# Patient Record
Sex: Male | Born: 1970 | Race: White | Hispanic: No | Marital: Married | Smoking: Never smoker
Health system: Southern US, Community
[De-identification: ages and names within clinical notes are randomized; demographics above are authoritative.]

## PROBLEM LIST (undated history)

## (undated) DIAGNOSIS — I10 Essential (primary) hypertension: Secondary | ICD-10-CM

## (undated) HISTORY — DX: Essential (primary) hypertension: I10

## (undated) HISTORY — PX: TONSILLECTOMY AND ADENOIDECTOMY: SUR1326

---

## 2006-10-24 ENCOUNTER — Ambulatory Visit: Payer: Self-pay | Admitting: Family Medicine

## 2006-10-24 DIAGNOSIS — I10 Essential (primary) hypertension: Secondary | ICD-10-CM | POA: Insufficient documentation

## 2006-11-08 ENCOUNTER — Ambulatory Visit: Payer: Self-pay

## 2006-11-08 ENCOUNTER — Ambulatory Visit: Payer: Self-pay | Admitting: Cardiology

## 2006-11-08 ENCOUNTER — Encounter: Payer: Self-pay | Admitting: Family Medicine

## 2006-11-17 ENCOUNTER — Encounter: Payer: Self-pay | Admitting: Family Medicine

## 2006-11-20 ENCOUNTER — Encounter: Payer: Self-pay | Admitting: Family Medicine

## 2006-11-20 LAB — CONVERTED CEMR LAB
Albumin: 4.6 g/dL (ref 3.5–5.2)
BUN: 13 mg/dL (ref 6–23)
CO2: 25 meq/L (ref 19–32)
Calcium: 9 mg/dL (ref 8.4–10.5)
Chloride: 105 meq/L (ref 96–112)
Cholesterol, target level: 200 mg/dL
Cholesterol: 237 mg/dL — ABNORMAL HIGH (ref 0–200)
Glucose, Bld: 94 mg/dL (ref 70–99)
Potassium: 4.3 meq/L (ref 3.5–5.3)
Sodium: 140 meq/L (ref 135–145)
TSH: 3.803 microintl units/mL (ref 0.350–5.50)
Total CHOL/HDL Ratio: 6.4
Total Protein: 7.2 g/dL (ref 6.0–8.3)
VLDL: 44 mg/dL — ABNORMAL HIGH (ref 0–40)

## 2006-11-22 ENCOUNTER — Telehealth: Payer: Self-pay | Admitting: Family Medicine

## 2006-11-28 ENCOUNTER — Encounter: Admission: RE | Admit: 2006-11-28 | Discharge: 2006-11-28 | Payer: Self-pay | Admitting: Family Medicine

## 2006-11-28 ENCOUNTER — Ambulatory Visit: Payer: Self-pay | Admitting: Family Medicine

## 2006-12-06 ENCOUNTER — Encounter: Payer: Self-pay | Admitting: Family Medicine

## 2007-11-07 ENCOUNTER — Ambulatory Visit: Payer: Self-pay | Admitting: Family Medicine

## 2007-11-07 ENCOUNTER — Encounter: Admission: RE | Admit: 2007-11-07 | Discharge: 2007-11-07 | Payer: Self-pay | Admitting: Family Medicine

## 2007-11-07 DIAGNOSIS — M545 Low back pain: Secondary | ICD-10-CM

## 2007-11-14 ENCOUNTER — Encounter: Admission: RE | Admit: 2007-11-14 | Discharge: 2007-12-19 | Payer: Self-pay | Admitting: Family Medicine

## 2007-11-14 ENCOUNTER — Encounter: Payer: Self-pay | Admitting: Family Medicine

## 2007-11-21 ENCOUNTER — Telehealth: Payer: Self-pay | Admitting: Family Medicine

## 2007-12-14 ENCOUNTER — Encounter: Payer: Self-pay | Admitting: Family Medicine

## 2008-07-09 ENCOUNTER — Ambulatory Visit: Payer: Self-pay | Admitting: Family Medicine

## 2008-07-10 ENCOUNTER — Encounter: Payer: Self-pay | Admitting: Family Medicine

## 2008-07-10 LAB — CONVERTED CEMR LAB
Albumin: 4.8 g/dL (ref 3.5–5.2)
Alkaline Phosphatase: 84 units/L (ref 39–117)
BUN: 15 mg/dL (ref 6–23)
Glucose, Bld: 95 mg/dL (ref 70–99)
Potassium: 4.6 meq/L (ref 3.5–5.3)
Total Bilirubin: 0.6 mg/dL (ref 0.3–1.2)

## 2008-07-11 LAB — CONVERTED CEMR LAB
Free T4: 1.08 ng/dL
T3, Free: 3.3 pg/mL

## 2008-08-05 ENCOUNTER — Ambulatory Visit: Payer: Self-pay | Admitting: Family Medicine

## 2008-08-06 LAB — CONVERTED CEMR LAB
Chloride: 103 meq/L (ref 96–112)
Creatinine, Ser: 1.13 mg/dL (ref 0.40–1.50)

## 2009-05-04 ENCOUNTER — Ambulatory Visit: Payer: Self-pay | Admitting: Family Medicine

## 2009-08-06 ENCOUNTER — Telehealth: Payer: Self-pay | Admitting: Family Medicine

## 2009-08-20 ENCOUNTER — Ambulatory Visit: Payer: Self-pay | Admitting: Family

## 2009-08-20 DIAGNOSIS — M719 Bursopathy, unspecified: Secondary | ICD-10-CM

## 2009-08-20 DIAGNOSIS — M67919 Unspecified disorder of synovium and tendon, unspecified shoulder: Secondary | ICD-10-CM | POA: Insufficient documentation

## 2010-02-15 ENCOUNTER — Telehealth: Payer: Self-pay | Admitting: Family Medicine

## 2010-02-22 ENCOUNTER — Ambulatory Visit: Payer: Self-pay | Admitting: Family Medicine

## 2010-03-02 ENCOUNTER — Telehealth: Payer: Self-pay | Admitting: Family Medicine

## 2010-05-05 NOTE — Progress Notes (Signed)
Summary: refill HCTZ  Phone Note Refill Request Message from:  Patient on February 15, 2010 5:19 PM  Refills Requested: Medication #1:  HYDROCHLOROTHIAZIDE 25 MG TABS Take 1 tablet by mouth once a day.   Dosage confirmed as above?Dosage Confirmed   Supply Requested: 1 month   Last Refilled: 06/23/2009 Next Appointment Scheduled: 02-22-10 Dr Linford Arnold Initial call taken by: Mervin Kung CMA Duncan Dull),  February 15, 2010 5:19 PM    Prescriptions: HYDROCHLOROTHIAZIDE 25 MG TABS (HYDROCHLOROTHIAZIDE) Take 1 tablet by mouth once a day  #30 x 0   Entered by:   Mervin Kung CMA (AAMA)   Authorized by:   [name] MD   Signed by:   Mervin Kung CMA (AAMA) on 02/15/2010   Method used:   Electronically to        [facility] [phone]* (retail)       [address] [place], [place]  [address]       Ph: [phone]       Fax: [phone]   RxID:   [id number]

## 2010-05-05 NOTE — Progress Notes (Signed)
Summary: Med  Phone Note Call from Patient Call back at Home Phone [phone]   Caller: Patient Call For: [name] MD Summary of Call: pt wants to know if you would send his Nasonex to his mail order company for 90 day supply like his other meds Initial call taken by: Kathlene November,  Aug 06, 2009 3:12 PM  Follow-up for Phone Call        OK to send to his mail order. Dont know company.  Follow-up by: [name] MD,  Aug 06, 2009 4:38 PM    Prescriptions: NASONEX 50 MCG/ACT  SUSP (MOMETASONE FUROATE) Use one spray each nostril every day  #90 day sup x 4   Entered by:   Kathlene November   Authorized by:   [name] MD   Signed by:   Kathlene November on 08/06/2009   Method used:   Print then Give to Patient   RxID:   [id number]

## 2010-05-05 NOTE — Assessment & Plan Note (Signed)
Summary: Rt. shoulder pain- jr   Vital Signs:  Patient profile:   40 year old male Height:      72 inches Weight:      199.50 pounds BMI:     27.15 Temp:     97.9 degrees F oral Pulse rate:   79 / minute Pulse rhythm:   regular Resp:     16 per minute BP sitting:   145 / 87  (right arm) Cuff size:   regular  Vitals Entered By: Mervin Kung CMA (Aug 20, 2009 2:45 PM) CC: room 4  Right shoulder pain x 1 week. Is Patient Diabetic? No   Primary Care Provider:  Linford Arnold, C  CC:  room 4  Right shoulder pain x 1 week.Marland Kitchen  History of Present Illness: Mr Fackler is a 40 year old male who presents today with complaint of 10 day history of right shoulder pain.  Notes that he has been working hard building a garage. Pain is improved with NSAIDS, worsens with activity. Overall feeling better.    Allergies (verified): 1)  ! * Benadryl  Physical Exam  General:  Well-developed,well-nourished,in no acute distress; alert,appropriate and cooperative throughout examination Head:  Normocephalic and atraumatic without obvious abnormalities. No apparent alopecia or balding. Msk:  No swelling of shoulder or tenderness to palpation.  Full ROM of shoulder, passive and active.  Did not shoulder "click" with ROM.   Psych:  Cognition and judgment appear intact. Alert and cooperative with normal attention span and concentration. No apparent delusions, illusions, hallucinations   Impression & Recommendations:  Problem # 1:  BURSITIS, RIGHT SHOULDER (ICD-726.10) Assessment New Likely due to recent work building garage.  Recommended, rest, ice, NSAIDS.  If not resolved in 1 month will consider referral to orthopedics for further evaluation.  Complete Medication List: 1)  Nasonex 50 Mcg/act Susp (Mometasone furoate) .... Use one spray each nostril every day 2)  Hydrochlorothiazide 25 Mg Tabs (Hydrochlorothiazide) .... Take 1 tablet by mouth once a day  Patient Instructions: 1)  You may move around  but avoid painful motions. Apply ice to sore area for 20 minutes 3-4 times a day for 2-3 days. 2)  Take 400-600mg  of Ibuprofen (Advil, Motrin) with food every 4-6 hours as needed for relief of pain 3)  Call if your symptoms worsen or are not resolved in 1 month.  Current Allergies (reviewed today): ! * BENADRYL

## 2010-05-05 NOTE — Assessment & Plan Note (Signed)
Summary: HTN   Vital Signs:  Patient profile:   40 year old male Height:      72 inches Weight:      201 pounds Pulse rate:   76 / minute BP sitting:   145 / 92  (right arm) Cuff size:   regular  Vitals Entered By: Avon Gully CMA, Duncan Dull) (February 22, 2010 9:48 AM) CC: f/u Bp, Hypertension Management   CC:  f/u Bp and Hypertension Management.  Hypertension History:      He denies headache, chest pain, palpitations, dyspnea with exertion, orthopnea, PND, peripheral edema, visual symptoms, neurologic problems, syncope, and side effects from treatment.  He notes no problems with any antihypertensive medication side effects.  Further comments include: Home BPs runnin in the 140s. Marland Kitchen        Positive major cardiovascular risk factors include hypertension and family history for ischemic heart disease (females less than 12 years old).  Negative major cardiovascular risk factors include male age less than 41 years old, no history of diabetes, and non-tobacco-user status.        Further assessment for target organ damage reveals no history of ASHD, stroke/TIA, or peripheral vascular disease.     Current Medications (verified): 1)  Nasonex 50 Mcg/act  Susp (Mometasone Furoate) .... Use One Spray Each Nostril Every Day 2)  Hydrochlorothiazide 25 Mg Tabs (Hydrochlorothiazide) .... Take 1 Tablet By Mouth Once A Day  Allergies (verified): 1)  ! * Benadryl  Comments:  Nurse/Medical Assistant: The patient's medications and allergies were reviewed with the patient and were updated in the Medication and Allergy Lists. Avon Gully CMA, Duncan Dull) (February 22, 2010 9:49 AM)  Physical Exam  General:  Well-developed,well-nourished,in no acute distress; alert,appropriate and cooperative throughout examination Head:  Normocephalic and atraumatic without obvious abnormalities. No apparent alopecia or balding. Lungs:  Normal respiratory effort, chest expands symmetrically. Lungs are clear to  auscultation, no crackles or wheezes. Heart:  Normal rate and regular rhythm. S1 and S2 normal without gallop, murmur, click, rub or other extra sounds. no carotic bruits.  Skin:  no rashes.   Psych:  Cognition and judgment appear intact. Alert and cooperative with normal attention span and concentration. No apparent delusions, illusions, hallucinations   Impression & Recommendations:  Problem # 1:  HYPERTENSION, BENIGN (ICD-401.1) Will add ACE to his regimen. Discussed potenetial SE of the medication. Call if any concerns. F/U in one month for recheck. If normal then will f/u in 4 months after that. Check BMP at next OV. Reviewed DASH diet as well.  His updated medication list for this problem includes:    Hydrochlorothiazide 25 Mg Tabs (Hydrochlorothiazide) .Marland Kitchen... Take 1 tablet by mouth once a day  BP today: 145/92 Prior BP: 145/87 (08/20/2009)  Prior 10 Yr Risk Heart Disease: 6 % (08/05/2008)  Labs Reviewed: K+: 4.7 (08/05/2008) Creat: : 1.13 (08/05/2008)   Chol: 237 (11/17/2006)   HDL: 37 (11/17/2006)   LDL: 156 (11/17/2006)   TG: 219 (11/17/2006)  Complete Medication List: 1)  Nasonex 50 Mcg/act Susp (Mometasone furoate) .... Use one spray each nostril every day 2)  Hydrochlorothiazide 25 Mg Tabs (Hydrochlorothiazide) .... Take 1 tablet by mouth once a day  Hypertension Assessment/Plan:      The patient's hypertensive risk group is category B: At least one risk factor (excluding diabetes) with no target organ damage.  His calculated 10 year risk of coronary heart disease is 9 %.  Today's blood pressure is 145/92.    Patient Instructions: 1)  DASH diet  ( googl it. http://www.myers.net/ website)  2)  Follow up in one month to recheck your blood pressure.    Orders Added: 1)  Est. Patient Level III [82956]

## 2010-05-05 NOTE — Progress Notes (Signed)
Summary: meds  Phone Note Call from Patient   Caller: Dad Call For: [name] MD Summary of Call: wife called and states there was supposed to be another medication that was to be added in addition to the HCTZ but it wasnt at the pharm.please advise Initial call taken by: Avon Gully CMA, Duncan Dull),  March 02, 2010 9:00 AM  Follow-up for Phone Call        Sorry, will send.  Follow-up by: [name] MD,  March 02, 2010 9:23 AM    New/Updated Medications: LISINOPRIL-HYDROCHLOROTHIAZIDE 20-25 MG TABS (LISINOPRIL-HYDROCHLOROTHIAZIDE) Take 1 tablet by mouth once a day Prescriptions: LISINOPRIL-HYDROCHLOROTHIAZIDE 20-25 MG TABS (LISINOPRIL-HYDROCHLOROTHIAZIDE) Take 1 tablet by mouth once a day  #30 x 1   Entered and Authorized by:   [name] MD   Signed by:   [name] MD on 03/02/2010   Method used:   Electronically to        [facility] [phone]* (retail)       [address] [place], [place]  [address]       Ph: [phone]       Fax: [phone]   RxID:   [id number]

## 2010-05-05 NOTE — Assessment & Plan Note (Signed)
Summary: back pain, acute   Vital Signs:  Patient profile:   40 year old male Height:      72 inches Weight:      204 pounds BMI:     27.77 Pulse rate:   71 / minute BP sitting:   138 / 84  (left arm) Cuff size:   regular  Vitals Entered By: Kathlene November (May 04, 2009 10:50 AM) CC: muscle spasms in lower back- went hiking over the weekend and lifting yesterday   Primary Care Provider:  Linford Arnold, C  CC:  muscle spasms in lower back- went hiking over the weekend and lifting yesterday.  History of Present Illness: muscle spasms in lower back- went hiking over the weekend and lifting some wood yesterday. Back felt progresively tighter. Has been getting sharp shooting pain. Unable to sleep last night.  Had back injury about 1.5 years ago and went to PT.  Feels centered in his back. IBU - helps some. No numbness and tingling. No fever or dysuria.   Current Medications (verified): 1)  Nasonex 50 Mcg/act  Susp (Mometasone Furoate) .... Use One Spray Each Nostril Every Day 2)  Hydrochlorothiazide 25 Mg Tabs (Hydrochlorothiazide) .... Take 1 Tablet By Mouth Once A Day  Allergies (verified): No Known Drug Allergies  Comments:  Nurse/Medical Assistant: The patient's medications and allergies were reviewed with the patient and were updated in the Medication and Allergy Lists. 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 Married to [employer] with 3 children.   Never Smoked Alcohol use-yes Drug use-no Regular exercise-yes  Physical Exam  General:  Well-developed,well-nourished,in no acute distress; alert,appropriate and cooperative throughout examination Msk:  Normal flexion, extension, rotation righta nd left, and side bendind.  Neg straight leg raise bilat.  Hip, knee, and ankle strength 5/5 bilat. Nontender over the lumbar spine or paraspinous muscles or the SI joints.    Extremities:  No LE edema.  Neurologic:  Patellar 2+ bilat.    Impression & Recommendations:  Problem # 1:  BACK PAIN, LUMBAR (ICD-724.2) Assessment Deteriorated NSAID adn muscle relaxer for pain relief. Reviewed exercises to stretch out the low back.Says he has some stretches at home for his PT that he can do. Avoid any heavy lifting or alot of bending or stooping. Continue his IBU 800mg  three times a day.  Can use the tramadol on top of that for extra pain relief, esp at night since keeping him awake.  Warned about potential sedation of the muscle relaxer. Don't drive iwth it. Call if not better in 2-3 weeks or sooner if getting worse.  His updated medication list for this problem includes:    Cyclobenzaprine Hcl 10 Mg Tabs (Cyclobenzaprine hcl) .Marland Kitchen... Take 1 tablet by mouth three times a day as needed muscle spasm    Tramadol Hcl 50 Mg Tabs (Tramadol hcl) .Marland Kitchen... Take 1 tablet by mouth three times a day as needed severe pain  Complete Medication List: 1)  Nasonex 50 Mcg/act Susp (Mometasone furoate) .... Use one spray each nostril every day 2)  Hydrochlorothiazide 25 Mg Tabs (Hydrochlorothiazide) .... Take 1 tablet by mouth once a day 3)  Cyclobenzaprine Hcl 10 Mg Tabs (Cyclobenzaprine hcl) .... Take 1 tablet by mouth three times a day as needed muscle spasm 4)  Tramadol Hcl 50 Mg Tabs (Tramadol hcl) .... Take 1 tablet by mouth three times a day as needed severe pain Prescriptions: TRAMADOL HCL  50 MG TABS (TRAMADOL HCL) Take 1 tablet by mouth three times a day as needed severe pain  #30 x 0   Entered and Authorized by:   [name] MD   Signed by:   [name] MD on 05/04/2009   Method used:   Electronically to        [facility] [phone]* (retail)       [address] [place], [place]  [address]       Ph: [phone]       Fax: [phone]   RxID:   [id number] CYCLOBENZAPRINE HCL 10 MG TABS (CYCLOBENZAPRINE HCL) Take 1 tablet by mouth three times a day as  needed muscle spasm  #30 x 0   Entered and Authorized by:   [name] MD   Signed by:   [name] MD on 05/04/2009   Method used:   Electronically to        [facility] [phone]* (retail)       [address] [place], [place]  [address]       Ph: [phone]       Fax: [phone]   RxID:   [id number]

## 2010-08-17 NOTE — Procedures (Signed)
[facility]                              EXERCISE TREADMILL   Brett Donaldson, Brett Donaldson                      MRN:          [id number]  DATE:11/08/2006                            DOB:          Oct 05, 1970    Brett Donaldson comes in today for a stress test scheduled by Dr. Linford Arnold  of [facility]. [facility].   He has been having some substernal chest pressure that is not exertion  related.   His baseline EKG is normal.   He exercised Bruce protocol for 10 minutes and 22 seconds.  METS level  achieved was 12.3.  His peak heart rate was 181, which was 98% of  predicted maximum heart rate.  His blood pressure increased to 169/67.  There were no ST segment changes and no chest pain.  Blood pressure came  down slowly in recovery.   CONCLUSION:  1. Negative adequate exercise treadmill.  2. Good exercise tolerance.  3. Low risk for obstructive coronary disease based on this study.     Thomas C. Daleen Squibb, MD, [facility]  Electronically Signed    TCW/MedQ  DD: 11/08/2006  DT: 11/08/2006  Job #: [id number]   cc:   [name], M.D.

## 2010-08-17 NOTE — Letter (Signed)
November 08, 2006    [name], M.D.  [facility]. [facility]  [address]  [address]  [place], [place] [address]   RE:  Brett Donaldson, Brett Donaldson  MRN:  [id number]  /  DOB:  [birth year]   Dear Dr. Linford Arnold:   I had the pleasure of exercising Brett Donaldson today in the office.  Please find enclosed copy of his stress test results.   He had excellent exercise tolerance with no ST segment changes, and no  symptom reproduction.  I think his risk of coronary disease is at this  point low based on this study.   I strongly agree with you that his blood pressure should be treated, and  also with his strong family history, perhaps aggressively treating his  lipids as well, depending on his desire to be proactive.  I will leave  this to your expertise.   Thank you again for allowing [place] to see him.    Sincerely,      Jesse Sans. Daleen Squibb, MD, [facility]  Electronically Signed    TCW/MedQ  DD: 11/08/2006  DT: 11/08/2006  Job #: [id number]

## 2011-07-15 ENCOUNTER — Encounter: Payer: Self-pay | Admitting: *Deleted

## 2011-07-18 ENCOUNTER — Ambulatory Visit (INDEPENDENT_AMBULATORY_CARE_PROVIDER_SITE_OTHER): Payer: BC Managed Care – PPO | Admitting: Family Medicine

## 2011-07-18 ENCOUNTER — Encounter: Payer: Self-pay | Admitting: Family Medicine

## 2011-07-18 VITALS — BP 142/89 | HR 114 | Ht 72.0 in | Wt 206.0 lb

## 2011-07-18 DIAGNOSIS — I10 Essential (primary) hypertension: Secondary | ICD-10-CM

## 2011-07-18 DIAGNOSIS — E785 Hyperlipidemia, unspecified: Secondary | ICD-10-CM | POA: Insufficient documentation

## 2011-07-18 MED ORDER — LISINOPRIL-HYDROCHLOROTHIAZIDE 10-12.5 MG PO TABS: 1.0000 | ORAL_TABLET | Freq: Every day | ORAL | Status: DC

## 2011-07-18 MED ORDER — SIMVASTATIN 40 MG PO TABS: 40.0000 mg | ORAL_TABLET | Freq: Every evening | ORAL | Status: DC

## 2011-07-18 MED ORDER — MOMETASONE FUROATE 50 MCG/ACT NA SUSP: 2.0000 | Freq: Every day | NASAL | Status: DC

## 2011-07-18 MED ORDER — FLUTICASONE PROPIONATE 50 MCG/ACT NA SUSP: 2.0000 | Freq: Every day | NASAL | Status: DC

## 2011-07-18 NOTE — Progress Notes (Signed)
Addended by: [name] D on: 07/18/2011 01:09 PM   Modules accepted: Orders

## 2011-07-18 NOTE — Progress Notes (Signed)
  Subjective:    Patient ID: Brett Donaldson, male    DOB: [birth year], 41 y.o.   MRN: [id number]  HPI HTN - Neesd to restart BP pill.  N oCP or SOB. No problems. Just never followed up so has been off his pill for one year. No regular exercise.  Had some labs done at work.    Review of Systems     Objective:   Physical Exam  Constitutional: He is oriented to person, place, and time. He appears well-developed and well-nourished.  HENT:  Head: Normocephalic and atraumatic.  Cardiovascular: Normal rate, regular rhythm and normal heart sounds.        No carotid bruits. Radial pulse 2+.   Pulmonary/Chest: Effort normal and breath sounds normal.  Neurological: He is alert and oriented to person, place, and time.  Skin: Skin is warm and dry.  Psychiatric: He has a normal mood and affect. His behavior is normal.          Assessment & Plan:  HTN- uncontrolled. Need to restart medication. I did lower the dose. He did tolerate it well the one month he took it. F/u in 6 weeks and recheck BMP at that time.   Hyperlipidemia - he will fax a copy of his cholesterol labwork from work that he had done earlier this year. Because of his family history of CAD and his personal history high blood pressure like him to start a statin. We discussed pros and cons potential for benefit and risks. We'll start simvastatin 40 mg at bedtime. When he follows up in 6 weeks we can recheck a lipid panel and CMP at that time.

## 2011-08-10 ENCOUNTER — Other Ambulatory Visit: Payer: Self-pay | Admitting: *Deleted

## 2011-08-10 MED ORDER — LISINOPRIL-HYDROCHLOROTHIAZIDE 10-12.5 MG PO TABS: 1.0000 | ORAL_TABLET | Freq: Every day | ORAL | Status: DC

## 2011-08-10 MED ORDER — SIMVASTATIN 40 MG PO TABS: 40.0000 mg | ORAL_TABLET | Freq: Every evening | ORAL | Status: DC

## 2011-08-30 ENCOUNTER — Ambulatory Visit (INDEPENDENT_AMBULATORY_CARE_PROVIDER_SITE_OTHER): Payer: BC Managed Care – PPO | Admitting: Family Medicine

## 2011-08-30 ENCOUNTER — Encounter: Payer: Self-pay | Admitting: Family Medicine

## 2011-08-30 VITALS — BP 112/63 | HR 66 | Wt 201.0 lb

## 2011-08-30 DIAGNOSIS — I10 Essential (primary) hypertension: Secondary | ICD-10-CM

## 2011-08-30 DIAGNOSIS — E785 Hyperlipidemia, unspecified: Secondary | ICD-10-CM

## 2011-08-30 NOTE — Progress Notes (Signed)
  Subjective:    Patient ID: Brett Donaldson, male    DOB: 09-Dec-1970, 41 y.o.   MRN: [id number]  HPI HTN - regular exercise, has been walking every night for exercise. No chest pain or shortness of breath. He did restart the medication and is here for repeat blood pressure check. He'll come off of it previously. Though we didn't restart it at a lower dose than what he had been taking. He did have some dizziness the first week or 2 that he started the process that has resolved.   Review of Systems     Objective:   Physical Exam  Constitutional: He is oriented to person, place, and time. He appears well-developed and well-nourished.  HENT:  Head: Normocephalic and atraumatic.  Cardiovascular: Normal rate, regular rhythm and normal heart sounds.   Pulmonary/Chest: Effort normal and breath sounds normal.  Neurological: He is alert and oriented to person, place, and time.  Skin: Skin is warm and dry.  Psychiatric: He has a normal mood and affect. His behavior is normal.          Assessment & Plan:  HTN -well controlled. Followup in 6 months. He should have enough refills until then. I did warn that because of the diuretic use be careful being out in the sun for prolonged periods and making sure that he is well-hydrated. Continue regular exercise and low salt diet. He is due for CMP and fasting lipid panel.  Hyperlipidemia-he is well over due to recheck his lipids. He says he has been taking her statin regularly without any side effects or complications.

## 2011-08-31 ENCOUNTER — Other Ambulatory Visit: Payer: Self-pay | Admitting: *Deleted

## 2011-08-31 ENCOUNTER — Telehealth: Payer: Self-pay | Admitting: *Deleted

## 2011-08-31 LAB — COMPLETE METABOLIC PANEL WITH GFR
ALT: 33 U/L (ref 0–53)
AST: 35 U/L (ref 0–37)
Albumin: 4.8 g/dL (ref 3.5–5.2)
BUN: 15 mg/dL (ref 6–23)
Calcium: 9.9 mg/dL (ref 8.4–10.5)
Chloride: 103 mEq/L (ref 96–112)
Potassium: 4.5 mEq/L (ref 3.5–5.3)
Sodium: 139 mEq/L (ref 135–145)
Total Protein: 7.1 g/dL (ref 6.0–8.3)

## 2011-08-31 LAB — LIPID PANEL
LDL Cholesterol: 101 mg/dL — ABNORMAL HIGH (ref 0–99)
VLDL: 53 mg/dL — ABNORMAL HIGH (ref 0–40)

## 2011-08-31 MED ORDER — AZITHROMYCIN 250 MG PO TABS: ORAL_TABLET | ORAL | Status: AC

## 2011-08-31 MED ORDER — AZITHROMYCIN 250 MG PO TABS: ORAL_TABLET | ORAL | Status: DC

## 2011-08-31 NOTE — Telephone Encounter (Signed)
Z-Pack sent ot pharmacy per Dr. Shelah Lewandowsky request.

## 2012-06-09 ENCOUNTER — Other Ambulatory Visit: Payer: Self-pay | Admitting: Family Medicine

## 2012-06-12 ENCOUNTER — Telehealth: Payer: Self-pay | Admitting: *Deleted

## 2012-06-12 NOTE — Telephone Encounter (Signed)
Called and lmovm informing pt that we will not refill his meds until he makes an appt to be seen.Loralee Pacas [place]

## 2013-05-01 ENCOUNTER — Ambulatory Visit (INDEPENDENT_AMBULATORY_CARE_PROVIDER_SITE_OTHER): Payer: BC Managed Care – PPO | Admitting: Family Medicine

## 2013-05-01 ENCOUNTER — Encounter: Payer: Self-pay | Admitting: Family Medicine

## 2013-05-01 VITALS — BP 134/75 | HR 64 | Temp 97.9°F | Ht 72.0 in | Wt 197.0 lb

## 2013-05-01 DIAGNOSIS — I10 Essential (primary) hypertension: Secondary | ICD-10-CM

## 2013-05-01 DIAGNOSIS — M545 Low back pain, unspecified: Secondary | ICD-10-CM

## 2013-05-01 DIAGNOSIS — E785 Hyperlipidemia, unspecified: Secondary | ICD-10-CM

## 2013-05-01 MED ORDER — SIMVASTATIN 40 MG PO TABS: ORAL_TABLET | ORAL | Status: DC

## 2013-05-01 MED ORDER — PREDNISONE 20 MG PO TABS: 40.0000 mg | ORAL_TABLET | Freq: Every day | ORAL | Status: DC

## 2013-05-01 MED ORDER — CYCLOBENZAPRINE HCL 10 MG PO TABS: 10.0000 mg | ORAL_TABLET | Freq: Every evening | ORAL | Status: DC | PRN

## 2013-05-01 MED ORDER — LISINOPRIL-HYDROCHLOROTHIAZIDE 10-12.5 MG PO TABS: ORAL_TABLET | ORAL | Status: DC

## 2013-05-01 NOTE — Progress Notes (Signed)
   Subjective:    Patient ID: Brett Donaldson, male    DOB: 1970-06-02, 43 y.o.   MRN: [id number]  HPI pt reports that he is having low back pain for past 2 weeks. it wakes him up at night it feels like a catching/grabbing that brings him to his knees. he has a hx of a back injury. No raditaiton into the left.  Worse on the left.  NSAIDs help some.  Initially injured back about 3 years ago and took him 5-6 months. Did PT at that time but didn't seem to really help and it finally got better.   He has had sciatica in the past but none right now. It's worse with changing position and bending over and rolling over in bed.  Hypertension- Pt denies chest pain, SOB, dizziness, or heart palpitations.  Taking meds as directed w/o problems.  Denies medication side effects.    Hyper lipidemia-tolerating statin well without any side effects or myalgias. Does need new refill prescription. He ran out a few weeks ago. He did have cholesterol checked at work and says he will fax this over to us.   Review of Systems     Objective:   Physical Exam  Constitutional: He is oriented to person, place, and time. He appears well-developed and well-nourished.  HENT:  Head: Normocephalic and atraumatic.  Cardiovascular: Normal rate, regular rhythm and normal heart sounds.   Pulmonary/Chest: Effort normal and breath sounds normal.  Musculoskeletal:  Low back with normal flexion, extension, rotation right and left, side bending. He did have significant pain with forward flexion. Nontender over the lumbar spine itself. Nontender over the SI joints. She's mildly tender over the paraspinous muscles. Hip, knee, ankle strength is 5 out of 5 bilaterally with patellar reflexes 2+ bilaterally.  Neurological: He is alert and oriented to person, place, and time.  Skin: Skin is warm and dry.  Psychiatric: He has a normal mood and affect. His behavior is normal.          Assessment & Plan:  Low back pain - likely secondary  to musculoskeletal pain. He has had minor injuries and pain on and off for greater than 15 years. Discussed with him that initially we start with conservative care. Will start prednisone 40 mg daily x5 days. Switch to ibuprofen which tends to work well for him. Take 3 times a day with food and water. Stop immediately if any GI irritation or upset. Will add muscle relaxer to his regimen. I warned him not to take it if he is going to operate a motor vehicle. He says it doesn't work well for him in the past. Also given a handout on some stretches and exercises to do on his own at home. Follow up if not improving over the next 2-3 weeks. At that point we can consider imaging and further evaluation if needed. He's not having any sciatic-type symptoms.  HTN- well controlled. Continue current regimen. Refill sent to mail order pharmacy today. Followup in 6 months.  Hyperlipidemia  - medication sent to mail order pharmacy today. He said he had blood work done at work and he will fax this over to us. Followup in 6 months.

## 2013-09-17 LAB — LIPID PANEL
CHOLESTEROL: 182 mg/dL (ref 0–200)
HDL: 34 mg/dL — AB (ref 35–70)
LDL Cholesterol: 96 mg/dL
Triglycerides: 259 mg/dL — AB (ref 40–160)

## 2013-09-17 LAB — HEMOGLOBIN A1C: Hgb A1c MFr Bld: 5.3 % (ref 4.0–6.0)

## 2013-10-24 ENCOUNTER — Ambulatory Visit (INDEPENDENT_AMBULATORY_CARE_PROVIDER_SITE_OTHER): Payer: BLUE CROSS/BLUE SHIELD | Admitting: Family Medicine

## 2013-10-24 ENCOUNTER — Encounter: Payer: Self-pay | Admitting: Family Medicine

## 2013-10-24 VITALS — BP 124/76 | HR 76 | Ht 72.0 in | Wt 198.0 lb

## 2013-10-24 DIAGNOSIS — Z0000 Encounter for general adult medical examination without abnormal findings: Secondary | ICD-10-CM

## 2013-10-24 MED ORDER — SIMVASTATIN 40 MG PO TABS: ORAL_TABLET | ORAL | Status: DC

## 2013-10-24 MED ORDER — LISINOPRIL-HYDROCHLOROTHIAZIDE 10-12.5 MG PO TABS: ORAL_TABLET | ORAL | Status: DC

## 2013-10-24 NOTE — Progress Notes (Signed)
Subjective:    Patient ID: Brett Donaldson, male    DOB: 05-26-1970, 43 y.o.   MRN: [id number]  HPI Patient here for complete physical exam today.  Hypertension- Pt denies chest pain, SOB, dizziness, or heart palpitations.  Taking meds as directed w/o problems.  Denies medication side effects.     Review of Systems Comprehensive review of systems is negative.  BP 124/76  Pulse 76  Ht 6' (1.829 m)  Wt 198 lb (89.812 kg)  BMI 26.85 kg/m2    Allergies  Allergen Reactions  . Diphenhydramine Hcl     REACTION: hives    Past Medical History  Diagnosis Date  . Hypertension     Past Surgical History  Procedure Laterality Date  . Tonsillectomy and adenoidectomy      History   Social History  . Marital Status: Married    Spouse Name: N/A    Number of Children: N/A  . Years of Education: N/A   Occupational History  . Not on file.   Social History Main Topics  . Smoking status: Never Smoker   . Smokeless tobacco: Not on file  . Alcohol Use: Yes  . Drug Use: No  . Sexual Activity:    Other Topics Concern  . Not on file   Social History Narrative  . No narrative on file    Family History  Problem Relation Age of Onset  . Heart disease Mother 40    5 valve bypass  . Hyperlipidemia Mother   . Hypertension Mother 93    Died age 45, smoker, obesity    Outpatient Encounter Prescriptions as of 10/24/2013  Medication Sig  . lisinopril-hydrochlorothiazide (PRINZIDE,ZESTORETIC) 10-12.5 MG per tablet TAKE 1 TABLET DAILY  . simvastatin (ZOCOR) 40 MG tablet TAKE 1 TABLET AT BEDTIME  . [DISCONTINUED] lisinopril-hydrochlorothiazide (PRINZIDE,ZESTORETIC) 10-12.5 MG per tablet TAKE 1 TABLET DAILY  . [DISCONTINUED] simvastatin (ZOCOR) 40 MG tablet TAKE 1 TABLET AT BEDTIME  . [DISCONTINUED] cyclobenzaprine (FLEXERIL) 10 MG tablet Take 1 tablet (10 mg total) by mouth at bedtime as needed for muscle spasms.  . [DISCONTINUED] fluticasone (FLONASE) 50 MCG/ACT nasal spray Place  2 sprays into the nose daily.  . [DISCONTINUED] predniSONE (DELTASONE) 20 MG tablet Take 2 tablets (40 mg total) by mouth daily.          Objective:   Physical Exam  Constitutional: He is oriented to person, place, and time. He appears well-developed and well-nourished.  HENT:  Head: Normocephalic and atraumatic.  Right Ear: External ear normal.  Left Ear: External ear normal.  Nose: Nose normal.  Mouth/Throat: Oropharynx is clear and moist.  Eyes: Conjunctivae and EOM are normal. Pupils are equal, round, and reactive to light.  Neck: Normal range of motion. Neck supple. No thyromegaly present.  Cardiovascular: Normal rate, regular rhythm, normal heart sounds and intact distal pulses.   Pulmonary/Chest: Effort normal and breath sounds normal.  Abdominal: Soft. Bowel sounds are normal. He exhibits no distension and no mass. There is no tenderness. There is no rebound and no guarding.  Musculoskeletal: Normal range of motion.  Lymphadenopathy:    He has no cervical adenopathy.  Neurological: He is alert and oriented to person, place, and time. He has normal reflexes.  Skin: Skin is warm and dry.  Psychiatric: He has a normal mood and affect. His behavior is normal. Judgment and thought content normal.          Assessment & Plan:  CPE Keep up a regular exercise program  and make sure you are eating a healthy diet Try to eat 4 servings of dairy a day, or if you are lactose intolerant take a calcium with vitamin D daily.  Your vaccines are up to date.   Hypertension-well-controlled. Continue current regimen. Followup in 6 months.  Hyperlipidemia-due to recheck lipid panel. Continue simvastatin 40 mg for now.  He says he had a fair amount of blood work done through his employer. He will fax those to me. As you to make sure that he has up-to-date lipids and renal function because of his medications as well as a potassium.

## 2013-10-29 ENCOUNTER — Ambulatory Visit: Payer: BC Managed Care – PPO | Admitting: Family Medicine

## 2014-04-20 ENCOUNTER — Other Ambulatory Visit: Payer: Self-pay | Admitting: Family Medicine

## 2014-04-28 ENCOUNTER — Encounter: Payer: Self-pay | Admitting: Family Medicine

## 2014-04-28 ENCOUNTER — Ambulatory Visit (INDEPENDENT_AMBULATORY_CARE_PROVIDER_SITE_OTHER): Payer: BLUE CROSS/BLUE SHIELD | Admitting: Family Medicine

## 2014-04-28 VITALS — BP 120/82 | HR 93 | Ht 72.0 in | Wt 199.0 lb

## 2014-04-28 DIAGNOSIS — R1310 Dysphagia, unspecified: Secondary | ICD-10-CM | POA: Diagnosis not present

## 2014-04-28 DIAGNOSIS — E785 Hyperlipidemia, unspecified: Secondary | ICD-10-CM

## 2014-04-28 DIAGNOSIS — I10 Essential (primary) hypertension: Secondary | ICD-10-CM | POA: Diagnosis not present

## 2014-04-28 DIAGNOSIS — T148 Other injury of unspecified body region: Secondary | ICD-10-CM | POA: Diagnosis not present

## 2014-04-28 DIAGNOSIS — W57XXXA Bitten or stung by nonvenomous insect and other nonvenomous arthropods, initial encounter: Secondary | ICD-10-CM

## 2014-04-28 MED ORDER — LISINOPRIL-HYDROCHLOROTHIAZIDE 10-12.5 MG PO TABS: 1.0000 | ORAL_TABLET | Freq: Every day | ORAL | Status: DC

## 2014-04-28 NOTE — Progress Notes (Signed)
   Subjective:    Patient ID: Brett Donaldson, male    DOB: [birth year], 44 y.o.   MRN: [id number]  HPI Hypertension- Pt denies chest pain, SOB, dizziness, or heart palpitations.  Taking meds as directed w/o problems.  Denies medication side effects.  Brought in copy of lipids from June done at work  Dysphagia - Has bothered him for year.  Says can feel sometimes build in his chest. Can usually swallow hard and it passes. But the other day happened and had to vomit and took awhile pass. Occ heartburn but not frequent. TUMS usually works well. Usually every 2 weeks.   Also had spider bite on 5th toe on medial side on the left foot. Says has been there for 6 weeks and just wanted me to look at it, to make sure that it's healing well. He says he saw the 2 bite marks in the middle initially but never saw a spider or bug.  Review of Systems     Objective:   Physical Exam  Constitutional: He is oriented to person, place, and time. He appears well-developed and well-nourished.  HENT:  Head: Normocephalic and atraumatic.  Cardiovascular: Normal rate, regular rhythm and normal heart sounds.   Pulmonary/Chest: Effort normal and breath sounds normal.  Neurological: He is alert and oriented to person, place, and time.  Skin: Skin is warm and dry.  Psychiatric: He has a normal mood and affect. His behavior is normal.    I did examine the area on his fifth digit, left foot. It's a small approximately 1 cm callus. I see no erythema induration or drainage.      Assessment & Plan:  Hypertension-well-controlled. Continue current regimen. Follow-up in 6 months. Due for BMP to check potassium and kidney function.  Dysphagia-will refer to gastroneurology for further evaluation. Will likely need endoscopy with possible dilatation. Explained that these are most commonly caused by esophageal strictures. It doesn't feel like he has any significant heartburn type symptoms.  Insect bite-area on the 10 mostly  is consistent with a callus at this point. Offered to shave the callus off but he declined and says he will just keep an eye on it.  Hyperlipidemia-he did bring his cluster levels from work. His LDL was down to 96 and triglycerides had come down some to 259 which is fantastic. We discussed possibly considering switching him to atorvastatin at some point though he is hesitant since he does well on the simvastatin. Also consider adding to Fish oil to his regimen daily.

## 2014-04-29 LAB — BASIC METABOLIC PANEL WITH GFR
BUN: 19 mg/dL (ref 6–23)
CALCIUM: 9.6 mg/dL (ref 8.4–10.5)
CO2: 22 meq/L (ref 19–32)
CREATININE: 1.34 mg/dL (ref 0.50–1.35)
Chloride: 101 mEq/L (ref 96–112)
GFR, EST AFRICAN AMERICAN: 74 mL/min
GFR, Est Non African American: 64 mL/min
GLUCOSE: 68 mg/dL — AB (ref 70–99)
POTASSIUM: 4.4 meq/L (ref 3.5–5.3)
SODIUM: 136 meq/L (ref 135–145)

## 2014-04-29 NOTE — Progress Notes (Signed)
Quick Note:  All labs are normal. ______ 

## 2014-05-11 ENCOUNTER — Encounter: Payer: Self-pay | Admitting: Emergency Medicine

## 2014-07-18 ENCOUNTER — Other Ambulatory Visit: Payer: Self-pay | Admitting: Family Medicine

## 2014-10-15 ENCOUNTER — Other Ambulatory Visit: Payer: Self-pay | Admitting: Family Medicine

## 2014-12-11 ENCOUNTER — Other Ambulatory Visit: Payer: Self-pay | Admitting: Family Medicine

## 2014-12-29 ENCOUNTER — Encounter: Payer: Self-pay | Admitting: Family Medicine

## 2014-12-29 ENCOUNTER — Ambulatory Visit (INDEPENDENT_AMBULATORY_CARE_PROVIDER_SITE_OTHER): Payer: BLUE CROSS/BLUE SHIELD | Admitting: Family Medicine

## 2014-12-29 VITALS — BP 122/70 | HR 69 | Temp 98.8°F | Ht 72.0 in | Wt 195.0 lb

## 2014-12-29 DIAGNOSIS — Z23 Encounter for immunization: Secondary | ICD-10-CM | POA: Diagnosis not present

## 2014-12-29 DIAGNOSIS — Z0000 Encounter for general adult medical examination without abnormal findings: Secondary | ICD-10-CM

## 2014-12-29 DIAGNOSIS — E785 Hyperlipidemia, unspecified: Secondary | ICD-10-CM | POA: Diagnosis not present

## 2014-12-29 DIAGNOSIS — I10 Essential (primary) hypertension: Secondary | ICD-10-CM | POA: Diagnosis not present

## 2014-12-29 MED ORDER — LISINOPRIL-HYDROCHLOROTHIAZIDE 10-12.5 MG PO TABS: 1.0000 | ORAL_TABLET | Freq: Every day | ORAL | Status: DC

## 2014-12-29 MED ORDER — SIMVASTATIN 40 MG PO TABS: ORAL_TABLET | ORAL | Status: DC

## 2014-12-29 MED ORDER — MELOXICAM 7.5 MG PO TABS: 7.5000 mg | ORAL_TABLET | Freq: Two times a day (BID) | ORAL | Status: DC | PRN

## 2014-12-29 MED ORDER — CYCLOBENZAPRINE HCL 10 MG PO TABS
10.0000 mg | ORAL_TABLET | Freq: Two times a day (BID) | ORAL | Status: DC | PRN
Start: 2014-12-29 — End: 2016-03-21

## 2014-12-29 NOTE — Progress Notes (Signed)
Subjective:    Patient ID: Brett Donaldson, male    DOB: [birth year], 43 y.o.   MRN: [id number]  HPI Is here today for a complete physical. He has no specific concerns or complaints. He has been biking and running over the summer for exercise.   Hypertension- Pt denies chest pain, SOB, dizziness, or heart palpitations.  Taking meds as directed w/o problems.  Denies medication side effects.    Hyperlipidemia on a statin area no myalgias or side effects.   Review of Systems Comprehensive review of systems is negative.  BP 122/70 mmHg  Pulse 69  Temp(Src) 98.8 F (37.1 C)  Ht 6' (1.829 m)  Wt 195 lb (88.451 kg)  BMI 26.44 kg/m2    Allergies  Allergen Reactions  . Diphenhydramine Hcl     REACTION: hives    Past Medical History  Diagnosis Date  . Hypertension     Past Surgical History  Procedure Laterality Date  . Tonsillectomy and adenoidectomy      Social History   Social History  . Marital Status: Married    Spouse Name: N/A  . Number of Children: N/A  . Years of Education: N/A   Occupational History  . Not on file.   Social History Main Topics  . Smoking status: Never Smoker   . Smokeless tobacco: Not on file  . Alcohol Use: Yes  . Drug Use: No  . Sexual Activity: Not on file   Other Topics Concern  . Not on file   Social History Narrative   Running and biking this summer.        Family History  Problem Relation Age of Onset  . Heart disease Mother 29    5 vaessel bypass  . Hyperlipidemia Mother   . Hypertension Mother 65    Died age 76, smoker, obesity  . Parkinson's disease Father 12    Outpatient Encounter Prescriptions as of 12/29/2014  Medication Sig  . lisinopril-hydrochlorothiazide (PRINZIDE,ZESTORETIC) 10-12.5 MG per tablet Take 1 tablet by mouth daily.  . simvastatin (ZOCOR) 40 MG tablet TAKE 1 TABLET AT BEDTIME  . [DISCONTINUED] lisinopril-hydrochlorothiazide (PRINZIDE,ZESTORETIC) 10-12.5 MG per tablet TAKE 1 TABLET DAILY  .  [DISCONTINUED] simvastatin (ZOCOR) 40 MG tablet TAKE 1 TABLET AT BEDTIME (MUST SCHEDULE A FOLLOW UP APPOINTMENT)  . cyclobenzaprine (FLEXERIL) 10 MG tablet Take 1 tablet (10 mg total) by mouth 2 (two) times daily as needed for muscle spasms.  . meloxicam (MOBIC) 7.5 MG tablet Take 1 tablet (7.5 mg total) by mouth 2 (two) times daily as needed for pain.   No facility-administered encounter medications on file as of 12/29/2014.          Objective:   Physical Exam  Constitutional: He is oriented to person, place, and time. He appears well-developed and well-nourished.  HENT:  Head: Normocephalic and atraumatic.  Right Ear: External ear normal.  Left Ear: External ear normal.  Nose: Nose normal.  Mouth/Throat: Oropharynx is clear and moist.  Eyes: Conjunctivae and EOM are normal. Pupils are equal, round, and reactive to light.  Neck: Normal range of motion. Neck supple. No thyromegaly present.  Cardiovascular: Normal rate, regular rhythm, normal heart sounds and intact distal pulses.   Pulmonary/Chest: Effort normal and breath sounds normal.  Abdominal: Soft. Bowel sounds are normal. He exhibits no distension and no mass. There is no tenderness. There is no rebound and no guarding.  Musculoskeletal: Normal range of motion.  Lymphadenopathy:    He has no cervical adenopathy.  Neurological: He is alert and oriented to person, place, and time. He has normal reflexes.  Skin: Skin is warm and dry.  Psychiatric: He has a normal mood and affect. His behavior is normal. Judgment and thought content normal.          Assessment & Plan:  Complete physical exam- Keep up a regular exercise program and make sure you are eating a healthy diet Try to eat 4 servings of dairy a day, or if you are lactose intolerant take a calcium with vitamin D daily.  Your vaccines are up to date.   Hypertension-well-controlled her continue current regimen. Medication refills sent. Due for CMP and fasting lipid  panel today. Next  Hyperlipemia-tolerate a statin well without any problems. Continue current regimen.

## 2014-12-29 NOTE — Patient Instructions (Signed)
Keep up a regular exercise program and make sure you are eating a healthy diet Try to eat 4 servings of dairy a day, or if you are lactose intolerant take a calcium with vitamin D daily.  Your vaccines are up to date.   

## 2015-07-30 ENCOUNTER — Other Ambulatory Visit: Payer: Self-pay | Admitting: Family Medicine

## 2016-02-29 ENCOUNTER — Encounter: Payer: Self-pay | Admitting: Emergency Medicine

## 2016-02-29 ENCOUNTER — Emergency Department (INDEPENDENT_AMBULATORY_CARE_PROVIDER_SITE_OTHER): Payer: BLUE CROSS/BLUE SHIELD

## 2016-02-29 ENCOUNTER — Emergency Department (INDEPENDENT_AMBULATORY_CARE_PROVIDER_SITE_OTHER)
Admission: EM | Admit: 2016-02-29 | Discharge: 2016-02-29 | Disposition: A | Discharge status: Home / Self Care | Payer: BLUE CROSS/BLUE SHIELD | Source: Home / Self Care | Attending: Family Medicine | Admitting: Family Medicine

## 2016-02-29 ENCOUNTER — Telehealth: Payer: Self-pay | Admitting: Emergency Medicine

## 2016-02-29 DIAGNOSIS — S99911A Unspecified injury of right ankle, initial encounter: Secondary | ICD-10-CM

## 2016-02-29 DIAGNOSIS — S93401A Sprain of unspecified ligament of right ankle, initial encounter: Secondary | ICD-10-CM

## 2016-02-29 DIAGNOSIS — M24871 Other specific joint derangements of right ankle, not elsewhere classified: Secondary | ICD-10-CM

## 2016-02-29 NOTE — ED Triage Notes (Signed)
Right ankle injury last night, swollen, bruised, painful.

## 2016-02-29 NOTE — ED Provider Notes (Signed)
CSN: [id number]     Arrival date & time 02/29/16  0807 History   First MD Initiated Contact with Patient 02/29/16 [phone]     Chief Complaint  Patient presents with  . Ankle Injury   (Consider location/radiation/quality/duration/timing/severity/associated sxs/prior Treatment) HPI Brett Donaldson is a 45 y.o. male presenting to UC with c/o Right ankle pain after twisting it while walking last night.  Pain is aching and sore, 7/10.  Pain is worse with ambulation and certain movements.  He also notes mild swelling and bruising to his ankle.  He took ibuprofen last night with mild to moderate relief. No other injuries. Denies prior injury or surgery to Right ankle.    Past Medical History:  Diagnosis Date  . Hypertension    Past Surgical History:  Procedure Laterality Date  . TONSILLECTOMY AND ADENOIDECTOMY     Family History  Problem Relation Age of Onset  . Heart disease Mother 3540    5 vaessel bypass  . Hyperlipidemia Mother   . Hypertension Mother 7636    Died age 46, smoker, obesity  . Parkinson's disease Father 2565   Social History  Substance Use Topics  . Smoking status: Never Smoker  . Smokeless tobacco: Never Used  . Alcohol use Yes    Review of Systems  Musculoskeletal: Positive for arthralgias and joint swelling. Negative for gait problem.  Skin: Positive for color change. Negative for wound.  Neurological: Negative for weakness and numbness.    Allergies  Diphenhydramine hcl  Home Medications   Prior to Admission medications   Medication Sig Start Date End Date Taking? Authorizing Provider  ibuprofen (ADVIL,MOTRIN) 200 MG tablet Take 200 mg by mouth every 6 (six) hours as needed.   Yes Historical Provider, MD  cyclobenzaprine (FLEXERIL) 10 MG tablet Take 1 tablet (10 mg total) by mouth 2 (two) times daily as needed for muscle spasms. 12/29/14   Agapito Gamesatherine D Metheney, MD  lisinopril-hydrochlorothiazide (PRINZIDE,ZESTORETIC) 10-12.5 MG tablet Take 1 tablet by mouth  daily. NEED FOLLOW UP APPOINTMENT FOR MORE REFILLS 07/30/15   Agapito Gamesatherine D Metheney, MD  meloxicam (MOBIC) 7.5 MG tablet Take 1 tablet (7.5 mg total) by mouth 2 (two) times daily as needed for pain. 12/29/14   Agapito Gamesatherine D Metheney, MD  simvastatin (ZOCOR) 40 MG tablet TAKE 1 TABLET AT BEDTIME 12/29/14   Agapito Gamesatherine D Metheney, MD   Meds Ordered and Administered this Visit  Medications - No data to display  BP 159/83 (BP Location: Left Arm)   Pulse 74   Temp 97.7 F (36.5 C) (Oral)   Ht 6' (1.829 m)   Wt 194 lb (88 kg)   SpO2 100%   BMI 26.31 kg/m  No data found.   Physical Exam  Constitutional: He is oriented to person, place, and time. He appears well-developed and well-nourished.  HENT:  Head: Normocephalic and atraumatic.  Eyes: EOM are normal.  Neck: Normal range of motion.  Cardiovascular: Normal rate.   Pulses:      Dorsalis pedis pulses are 2+ on the right side.       Posterior tibial pulses are 2+ on the right side.  Pulmonary/Chest: Effort normal.  Musculoskeletal: Normal range of motion. He exhibits edema and tenderness.  Right ankle, lateral aspect: mild edema, tender. Full ROM. No tenderness to medial aspect or foot. Calf is soft, non-tender.  Neurological: He is alert and oriented to person, place, and time.  Skin: Skin is warm and dry.  Right ankle: skin in tact. Mild ecchymosis  or lateral aspect. No erythema or warmth.  Psychiatric: He has a normal mood and affect. His behavior is normal.  Nursing note and vitals reviewed.   Urgent Care Course   Clinical Course     Procedures (including critical care time)  Labs Review Labs Reviewed - No data to display  Imaging Review Dg Ankle Complete Right  Result Date: 02/29/2016 CLINICAL DATA:  Right ankle lateral pain and swelling, stepped on a log last night, rolled ankle EXAM: RIGHT ANKLE - COMPLETE 3+ VIEW COMPARISON:  None. FINDINGS: Three views of the right ankle submitted. No displaced fracture or  subluxation. There is small avulsed fragment at the tip of distal tibia medial malleolus. This is of indeterminate age. Soft tissue swelling adjacent to lateral malleolus. Ankle mortise is preserved. Plantar and posterior spurring of calcaneus. IMPRESSION: No displaced fracture or subluxation. There is small avulsed fragment at the tip of distal tibia medial malleolus. This is of indeterminate age. Soft tissue swelling adjacent to lateral malleolus. Ankle mortise is preserved. Electronically Signed   By: Natasha MeadLiviu  Pop M.D.   On: 02/29/2016 08:52     MDM   1. Sprain of right ankle, unspecified ligament, initial encounter   2. Right ankle injury    Pt c/o Right ankle pain and swelling. PMS in tact.  Plain films: No acute findings.  Small avulsed fragment at tip of distal tibia medial malleolus.  Pt denies pain on this side. Will treat as ankle sprain. Ankle stirrup splint applied. Pt declined crutches noting he has some at home if needed. Declined work note.  F/u PCP in 1-2 weeks if needed.    Junius Finnerrin O'Malley, PA-C 02/29/16 1008

## 2016-03-21 ENCOUNTER — Other Ambulatory Visit: Payer: Self-pay | Admitting: Family Medicine

## 2016-03-21 ENCOUNTER — Encounter: Payer: Self-pay | Admitting: Family Medicine

## 2016-03-21 ENCOUNTER — Ambulatory Visit (INDEPENDENT_AMBULATORY_CARE_PROVIDER_SITE_OTHER): Payer: BLUE CROSS/BLUE SHIELD | Admitting: Family Medicine

## 2016-03-21 VITALS — BP 131/83 | HR 72 | Wt 186.0 lb

## 2016-03-21 DIAGNOSIS — E785 Hyperlipidemia, unspecified: Secondary | ICD-10-CM

## 2016-03-21 DIAGNOSIS — I10 Essential (primary) hypertension: Secondary | ICD-10-CM | POA: Diagnosis not present

## 2016-03-21 DIAGNOSIS — Z0000 Encounter for general adult medical examination without abnormal findings: Secondary | ICD-10-CM | POA: Diagnosis not present

## 2016-03-21 DIAGNOSIS — Z23 Encounter for immunization: Secondary | ICD-10-CM | POA: Diagnosis not present

## 2016-03-21 LAB — CBC WITH DIFFERENTIAL/PLATELET
BASOS PCT: 1 %
Basophils Absolute: 63 cells/uL (ref 0–200)
EOS ABS: 126 {cells}/uL (ref 15–500)
Eosinophils Relative: 2 %
HCT: 44.8 % (ref 38.5–50.0)
Hemoglobin: 15.3 g/dL (ref 13.2–17.1)
LYMPHS PCT: 27 %
Lymphs Abs: 1701 cells/uL (ref 850–3900)
MCH: 30.4 pg (ref 27.0–33.0)
MCHC: 34.2 g/dL (ref 32.0–36.0)
MCV: 88.9 fL (ref 80.0–100.0)
MONOS PCT: 9 %
MPV: 11.8 fL (ref 7.5–12.5)
Monocytes Absolute: 567 cells/uL (ref 200–950)
Neutro Abs: 3843 cells/uL (ref 1500–7800)
Neutrophils Relative %: 61 %
PLATELETS: 287 10*3/uL (ref 140–400)
RBC: 5.04 MIL/uL (ref 4.20–5.80)
RDW: 14.1 % (ref 11.0–15.0)
WBC: 6.3 10*3/uL (ref 3.8–10.8)

## 2016-03-21 LAB — COMPLETE METABOLIC PANEL WITH GFR
ALT: 16 U/L (ref 9–46)
AST: 22 U/L (ref 10–40)
Albumin: 4.6 g/dL (ref 3.6–5.1)
Alkaline Phosphatase: 91 U/L (ref 40–115)
BILIRUBIN TOTAL: 0.4 mg/dL (ref 0.2–1.2)
BUN: 13 mg/dL (ref 7–25)
CHLORIDE: 100 mmol/L (ref 98–110)
CO2: 29 mmol/L (ref 20–31)
CREATININE: 1.07 mg/dL (ref 0.60–1.35)
Calcium: 9.9 mg/dL (ref 8.6–10.3)
GFR, Est African American: >89 mL/min (ref >=60)
GFR, Est Non African American: 83 mL/min (ref >=60)
GLUCOSE: 83 mg/dL (ref 65–99)
Potassium: 4.2 mmol/L (ref 3.5–5.3)
SODIUM: 138 mmol/L (ref 135–146)
TOTAL PROTEIN: 7.3 g/dL (ref 6.1–8.1)

## 2016-03-21 LAB — LIPID PANEL
Cholesterol: 266 mg/dL — ABNORMAL HIGH (ref <200)
HDL: 30 mg/dL — ABNORMAL LOW (ref >40)
Total CHOL/HDL Ratio: 8.9 Ratio — ABNORMAL HIGH (ref <5.0)
Triglycerides: 479 mg/dL — ABNORMAL HIGH (ref <150)

## 2016-03-21 MED ORDER — LISINOPRIL-HYDROCHLOROTHIAZIDE 10-12.5 MG PO TABS: 1.0000 | ORAL_TABLET | Freq: Every day | ORAL | 1 refills | Status: DC

## 2016-03-21 MED ORDER — SIMVASTATIN 40 MG PO TABS: ORAL_TABLET | ORAL | 1 refills | Status: DC

## 2016-03-21 NOTE — Patient Instructions (Signed)
Keep up a regular exercise program and make sure you are eating a healthy diet Try to eat 4 servings of dairy a day, or if you are lactose intolerant take a calcium with vitamin D daily.  Your vaccines are up to date.   

## 2016-03-21 NOTE — Progress Notes (Signed)
Subjective:    Patient ID: Brett Donaldson, male    DOB: 29-Jul-1970, 45 y.o.   MRN: [id number]  HPI Here for CPE. Is still exercising by running and biking. Then the last few weeks he has not been as active because he sprained his right ankle but it is getting better.   Review of Systems   Comprehensive ROS is neg.   BP 131/83   Pulse 72   Wt 186 lb (84.4 kg)   BMI 25.23 kg/m     Allergies  Allergen Reactions  . Diphenhydramine Hcl     REACTION: hives    Past Medical History:  Diagnosis Date  . Hypertension     Past Surgical History:  Procedure Laterality Date  . TONSILLECTOMY AND ADENOIDECTOMY      Social History   Social History  . Marital status: Married    Spouse name: N/A  . Number of children: N/A  . Years of education: N/A   Occupational History  . Not on file.   Social History Main Topics  . Smoking status: Never Smoker  . Smokeless tobacco: Never Used  . Alcohol use Yes  . Drug use: No  . Sexual activity: Not on file   Other Topics Concern  . Not on file   Social History Narrative   Running and biking this summer.        Family History  Problem Relation Age of Onset  . Heart disease Mother 6240    5 vaessel bypass  . Hyperlipidemia Mother   . Hypertension Mother 5236    Died age 449, smoker, obesity  . Parkinson's disease Father 4265    Outpatient Encounter Prescriptions as of 03/21/2016  Medication Sig  . lisinopril-hydrochlorothiazide (PRINZIDE,ZESTORETIC) 10-12.5 MG tablet Take 1 tablet by mouth daily. NEED FOLLOW UP APPOINTMENT FOR MORE REFILLS  . simvastatin (ZOCOR) 40 MG tablet TAKE 1 TABLET AT BEDTIME  . [DISCONTINUED] cyclobenzaprine (FLEXERIL) 10 MG tablet Take 1 tablet (10 mg total) by mouth 2 (two) times daily as needed for muscle spasms.  . [DISCONTINUED] ibuprofen (ADVIL,MOTRIN) 200 MG tablet Take 200 mg by mouth every 6 (six) hours as needed.  . [DISCONTINUED] meloxicam (MOBIC) 7.5 MG tablet Take 1 tablet (7.5 mg total) by  mouth 2 (two) times daily as needed for pain.   No facility-administered encounter medications on file as of 03/21/2016.           Objective:   Physical Exam  Constitutional: He is oriented to person, place, and time. He appears well-developed and well-nourished.  HENT:  Head: Normocephalic and atraumatic.  Right Ear: External ear normal.  Left Ear: External ear normal.  Nose: Nose normal.  Mouth/Throat: Oropharynx is clear and moist.  Eyes: Conjunctivae and EOM are normal. Pupils are equal, round, and reactive to light.  Neck: Normal range of motion. Neck supple. No thyromegaly present.  Cardiovascular: Normal rate, regular rhythm, normal heart sounds and intact distal pulses.   Pulmonary/Chest: Effort normal and breath sounds normal.  Abdominal: Soft. Bowel sounds are normal. He exhibits no distension and no mass. There is no tenderness. There is no rebound and no guarding.  Musculoskeletal: Normal range of motion.  Lymphadenopathy:    He has no cervical adenopathy.  Neurological: He is alert and oriented to person, place, and time. He has normal reflexes.  Skin: Skin is warm and dry.  Psychiatric: He has a normal mood and affect. His behavior is normal. Judgment and thought content normal.  Assessment & Plan:  CPE Keep up a regular exercise program and make sure you are eating a healthy diet Try to eat 4 servings of dairy a day, or if you are lactose intolerant take a calcium with vitamin D daily.  Your vaccines are up to date.  Flu vaccine given.

## 2016-03-22 LAB — LDL CHOLESTEROL, DIRECT: Direct LDL: 147 mg/dL — ABNORMAL HIGH (ref <130)

## 2016-03-24 ENCOUNTER — Other Ambulatory Visit: Payer: Self-pay | Admitting: *Deleted

## 2016-03-24 DIAGNOSIS — E785 Hyperlipidemia, unspecified: Secondary | ICD-10-CM

## 2016-03-24 NOTE — Addendum Note (Signed)
Addended by: Deno EtienneBARKLEY, Gayl Ivanoff L on: 03/24/2016 08:19 AM   Modules accepted: Orders

## 2017-03-01 ENCOUNTER — Ambulatory Visit (INDEPENDENT_AMBULATORY_CARE_PROVIDER_SITE_OTHER): Payer: BLUE CROSS/BLUE SHIELD | Admitting: Family Medicine

## 2017-03-01 ENCOUNTER — Encounter: Payer: Self-pay | Admitting: Family Medicine

## 2017-03-01 VITALS — BP 156/84 | HR 64 | Ht 72.0 in | Wt 186.0 lb

## 2017-03-01 DIAGNOSIS — Z23 Encounter for immunization: Secondary | ICD-10-CM | POA: Diagnosis not present

## 2017-03-01 DIAGNOSIS — I10 Essential (primary) hypertension: Secondary | ICD-10-CM | POA: Diagnosis not present

## 2017-03-01 DIAGNOSIS — Z0000 Encounter for general adult medical examination without abnormal findings: Secondary | ICD-10-CM | POA: Diagnosis not present

## 2017-03-01 DIAGNOSIS — S39012A Strain of muscle, fascia and tendon of lower back, initial encounter: Secondary | ICD-10-CM | POA: Diagnosis not present

## 2017-03-01 DIAGNOSIS — E785 Hyperlipidemia, unspecified: Secondary | ICD-10-CM

## 2017-03-01 LAB — COMPLETE METABOLIC PANEL WITH GFR
AG RATIO: 1.8 (calc) (ref 1.0–2.5)
ALT: 25 U/L (ref 9–46)
AST: 27 U/L (ref 10–40)
Albumin: 4.6 g/dL (ref 3.6–5.1)
Alkaline phosphatase (APISO): 74 U/L (ref 40–115)
BUN: 15 mg/dL (ref 7–25)
CALCIUM: 9.4 mg/dL (ref 8.6–10.3)
CHLORIDE: 106 mmol/L (ref 98–110)
CO2: 29 mmol/L (ref 20–32)
CREATININE: 0.97 mg/dL (ref 0.60–1.35)
GFR, Est African American: 107 mL/min/{1.73_m2} (ref >=60)
GFR, Est Non African American: 93 mL/min/{1.73_m2} (ref >=60)
GLOBULIN: 2.5 g/dL (ref 1.9–3.7)
Glucose, Bld: 96 mg/dL (ref 65–99)
POTASSIUM: 4.4 mmol/L (ref 3.5–5.3)
SODIUM: 140 mmol/L (ref 135–146)
TOTAL PROTEIN: 7.1 g/dL (ref 6.1–8.1)
Total Bilirubin: 0.6 mg/dL (ref 0.2–1.2)

## 2017-03-01 LAB — LIPID PANEL W/REFLEX DIRECT LDL
CHOLESTEROL: 271 mg/dL — AB (ref <200)
HDL: 44 mg/dL (ref >40)
LDL Cholesterol (Calc): 189 mg/dL (calc) — ABNORMAL HIGH
Non-HDL Cholesterol (Calc): 227 mg/dL (calc) — ABNORMAL HIGH (ref <130)
Total CHOL/HDL Ratio: 6.2 (calc) — ABNORMAL HIGH (ref <5.0)
Triglycerides: 196 mg/dL — ABNORMAL HIGH (ref <150)

## 2017-03-01 MED ORDER — SIMVASTATIN 40 MG PO TABS: ORAL_TABLET | ORAL | 3 refills | Status: DC

## 2017-03-01 MED ORDER — CYCLOBENZAPRINE HCL 10 MG PO TABS: 10.0000 mg | ORAL_TABLET | Freq: Three times a day (TID) | ORAL | 0 refills | Status: DC | PRN

## 2017-03-01 MED ORDER — LISINOPRIL-HYDROCHLOROTHIAZIDE 10-12.5 MG PO TABS: 1.0000 | ORAL_TABLET | Freq: Every day | ORAL | 3 refills | Status: DC

## 2017-03-01 NOTE — Progress Notes (Signed)
Subjective:    Patient ID: Brett Donaldson, male    DOB: [birth year], 46 y.o.   MRN: [id number]  HPI 46 year old male is here today for complete physical exam. Has a history of hypertension. Is been off his medications for several months at this point. He was having some difficulty getting the medication from his mail order and has been traveling back and forth to [place] for work for several months at a time and hasn't been able to try to call and crit the situation.   he's really worked hard over the last year to lose weight, change his diet and start exercising regularly.    He also complains of left-sided low back pain. He says every couple years older was back out he says usually within a week he feels much better in the past muscle relaxers have worked really well for him he says usually by the time he takes the third one it seems to be better. He denies any radicular symptoms. He was moving some stuff around the house including a heavy dog house over the weekend. Is been trying to do some stretches. No other treatments.   Review of Systems  BP (!) 166/85   Pulse 64   Ht 6' (1.829 m)   Wt 186 lb (84.4 kg)   SpO2 100%   BMI 25.23 kg/m     Allergies  Allergen Reactions  . Diphenhydramine Hcl     REACTION: hives    Past Medical History:  Diagnosis Date  . Hypertension     Past Surgical History:  Procedure Laterality Date  . TONSILLECTOMY AND ADENOIDECTOMY      Social History   Socioeconomic History  . Marital status: Married    Spouse name: Not on file  . Number of children: Not on file  . Years of education: Not on file  . Highest education level: Not on file  Social Needs  . Financial resource strain: Not on file  . Food insecurity - worry: Not on file  . Food insecurity - inability: Not on file  . Transportation needs - medical: Not on file  . Transportation needs - non-medical: Not on file  Occupational History  . Not on file  Tobacco Use  . Smoking  status: Never Smoker  . Smokeless tobacco: Never Used  Substance and Sexual Activity  . Alcohol use: Yes  . Drug use: No  . Sexual activity: Not on file  Other Topics Concern  . Not on file  Social History Narrative   Running and biking this summer.        Family History  Problem Relation Age of Onset  . Heart disease Mother 140       5 vaessel bypass  . Hyperlipidemia Mother   . Hypertension Mother 6036       Died age 47, smoker, obesity  . Parkinson's disease Father 4065    Outpatient Encounter Medications as of 03/01/2017  Medication Sig  . cyclobenzaprine (FLEXERIL) 10 MG tablet Take 1 tablet (10 mg total) by mouth 3 (three) times daily as needed for muscle spasms.  Marland Kitchen. lisinopril-hydrochlorothiazide (PRINZIDE,ZESTORETIC) 10-12.5 MG tablet Take 1 tablet by mouth daily.  . simvastatin (ZOCOR) 40 MG tablet TAKE 1 TABLET AT BEDTIME  . [DISCONTINUED] lisinopril-hydrochlorothiazide (PRINZIDE,ZESTORETIC) 10-12.5 MG tablet Take 1 tablet by mouth daily. (Patient not taking: Reported on 03/01/2017)  . [DISCONTINUED] simvastatin (ZOCOR) 40 MG tablet TAKE 1 TABLET AT BEDTIME (Patient not taking: Reported on 03/01/2017)   No facility-administered  encounter medications on file as of 03/01/2017.          Objective:   Physical Exam  Constitutional: He is oriented to person, place, and time. He appears well-developed and well-nourished.  HENT:  Head: Normocephalic and atraumatic.  Right Ear: External ear normal.  Left Ear: External ear normal.  Nose: Nose normal.  Mouth/Throat: Oropharynx is clear and moist.  Eyes: Conjunctivae and EOM are normal. Pupils are equal, round, and reactive to light.  Neck: Normal range of motion. Neck supple. No thyromegaly present.  Cardiovascular: Normal rate, regular rhythm, normal heart sounds and intact distal pulses.  Pulmonary/Chest: Effort normal and breath sounds normal.  Abdominal: Soft. Bowel sounds are normal. He exhibits no distension and no  mass. There is no tenderness. There is no rebound and no guarding.  Musculoskeletal: Normal range of motion.  Difficulty lying back on the table because of low back spasms. He has a lot of tightness in his hamstrings bilaterally but no radicular symptoms with leg lift. Looks to me strength is 5 out of 5. Patellar reflexes 1+ bilaterally.  Lymphadenopathy:    He has no cervical adenopathy.  Neurological: He is alert and oriented to person, place, and time. He has normal reflexes.  Skin: Skin is warm and dry. No rash noted.  Psychiatric: He has a normal mood and affect. His behavior is normal. Judgment and thought content normal.      Assessment & Plan:   CPE Keep up a regular exercise program and make sure you are eating a healthy diet Try to eat 4 servings of dairy a day, or if you are lactose intolerant take a calcium with vitamin D daily.  Flu vaccine and tdap given today.  HTN- restart medication. F/U in 2 weeks for nurse visit.    Hyperlipidemia-he wants to check his cholesterol off the medication since he's really worked hard over the last year to lose weight, change his diet and start exercising regularly think that's reasonable. I did go ahead and refill his statin though.  Acute low back pain-recommend home stretches. Handout provided. Okay to use Aleve or ibuprofen for inflammation and pain relief and heat. If not improving in the next couple weeks and please let us know.

## 2017-03-01 NOTE — Patient Instructions (Addendum)
Keep up a regular exercise program and make sure you are eating a healthy diet Try to eat 4 servings of dairy a day, or if you are lactose intolerant take a calcium with vitamin D daily.  Your vaccines are up to date.  If your back is not feeling better with the stretches over the next couple weeks and please let us know.

## 2017-03-17 ENCOUNTER — Ambulatory Visit: Payer: BLUE CROSS/BLUE SHIELD

## 2018-03-07 ENCOUNTER — Encounter: Payer: Self-pay | Admitting: Family Medicine

## 2018-03-07 ENCOUNTER — Ambulatory Visit (INDEPENDENT_AMBULATORY_CARE_PROVIDER_SITE_OTHER): Payer: BLUE CROSS/BLUE SHIELD | Admitting: Family Medicine

## 2018-03-07 VITALS — BP 128/66 | HR 68 | Ht 70.87 in | Wt 189.0 lb

## 2018-03-07 DIAGNOSIS — I10 Essential (primary) hypertension: Secondary | ICD-10-CM | POA: Diagnosis not present

## 2018-03-07 DIAGNOSIS — Z0000 Encounter for general adult medical examination without abnormal findings: Secondary | ICD-10-CM | POA: Diagnosis not present

## 2018-03-07 DIAGNOSIS — E785 Hyperlipidemia, unspecified: Secondary | ICD-10-CM

## 2018-03-07 LAB — COMPLETE METABOLIC PANEL WITH GFR
AG RATIO: 2 (calc) (ref 1.0–2.5)
ALT: 54 U/L — ABNORMAL HIGH (ref 9–46)
AST: 42 U/L — ABNORMAL HIGH (ref 10–40)
Albumin: 4.6 g/dL (ref 3.6–5.1)
Alkaline phosphatase (APISO): 66 U/L (ref 40–115)
BUN: 14 mg/dL (ref 7–25)
CHLORIDE: 102 mmol/L (ref 98–110)
CO2: 31 mmol/L (ref 20–32)
Calcium: 9.5 mg/dL (ref 8.6–10.3)
Creat: 1.12 mg/dL (ref 0.60–1.35)
GFR, Est African American: 90 mL/min/{1.73_m2} (ref >=60)
GFR, Est Non African American: 77 mL/min/{1.73_m2} (ref >=60)
Globulin: 2.3 g/dL (calc) (ref 1.9–3.7)
Glucose, Bld: 94 mg/dL (ref 65–99)
POTASSIUM: 4.1 mmol/L (ref 3.5–5.3)
Sodium: 138 mmol/L (ref 135–146)
Total Bilirubin: 0.5 mg/dL (ref 0.2–1.2)
Total Protein: 6.9 g/dL (ref 6.1–8.1)

## 2018-03-07 LAB — CBC
HCT: 42.6 % (ref 38.5–50.0)
Hemoglobin: 14.6 g/dL (ref 13.2–17.1)
MCH: 30.5 pg (ref 27.0–33.0)
MCHC: 34.3 g/dL (ref 32.0–36.0)
MCV: 88.9 fL (ref 80.0–100.0)
MPV: 11.2 fL (ref 7.5–12.5)
Platelets: 263 10*3/uL (ref 140–400)
RBC: 4.79 10*6/uL (ref 4.20–5.80)
RDW: 13 % (ref 11.0–15.0)
WBC: 4.7 10*3/uL (ref 3.8–10.8)

## 2018-03-07 LAB — LIPID PANEL
Cholesterol: 236 mg/dL — ABNORMAL HIGH (ref <200)
HDL: 37 mg/dL — ABNORMAL LOW (ref >40)
LDL Cholesterol (Calc): 153 mg/dL (calc) — ABNORMAL HIGH
Non-HDL Cholesterol (Calc): 199 mg/dL (calc) — ABNORMAL HIGH (ref <130)
Total CHOL/HDL Ratio: 6.4 (calc) — ABNORMAL HIGH (ref <5.0)
Triglycerides: 279 mg/dL — ABNORMAL HIGH (ref <150)

## 2018-03-07 MED ORDER — LISINOPRIL-HYDROCHLOROTHIAZIDE 10-12.5 MG PO TABS: 1.0000 | ORAL_TABLET | Freq: Every day | ORAL | 3 refills | Status: DC

## 2018-03-07 MED ORDER — SIMVASTATIN 40 MG PO TABS: ORAL_TABLET | ORAL | 3 refills | Status: DC

## 2018-03-07 NOTE — Patient Instructions (Signed)

## 2018-03-07 NOTE — Progress Notes (Signed)
Established Patient Office Visit  Subjective:  Patient ID: Brett Donaldson, male    DOB: 01/28/1971  Age: 47 y.o. MRN: [id number]  CC:  Chief Complaint  Patient presents with  . Annual Exam    HPI Brett Donaldson presents for CPE.  He is doing well overall.  No particular concerns today.  He walks about 2 miles per day.  Reports stress levels are acceptable.  He would like his prescriptions written for 90-day supply.  Past Medical History:  Diagnosis Date  . Hypertension     Past Surgical History:  Procedure Laterality Date  . TONSILLECTOMY AND ADENOIDECTOMY      Family History  Problem Relation Age of Onset  . Heart disease Mother 32       5 vaessel bypass  . Hyperlipidemia Mother   . Hypertension Mother 58       Died age 66, smoker, obesity  . Parkinson's disease Father 83    Social History   Socioeconomic History  . Marital status: Married    Spouse name: Not on file  . Number of children: Not on file  . Years of education: Not on file  . Highest education level: Not on file  Occupational History  . Not on file  Social Needs  . Financial resource strain: Not on file  . Food insecurity:    Worry: Not on file    Inability: Not on file  . Transportation needs:    Medical: Not on file    Non-medical: Not on file  Tobacco Use  . Smoking status: Never Smoker  . Smokeless tobacco: Never Used  Substance and Sexual Activity  . Alcohol use: Yes  . Drug use: No  . Sexual activity: Not on file  Lifestyle  . Physical activity:    Days per week: Not on file    Minutes per session: Not on file  . Stress: Not on file  Relationships  . Social connections:    Talks on phone: Not on file    Gets together: Not on file    Attends religious service: Not on file    Active member of club or organization: Not on file    Attends meetings of clubs or organizations: Not on file    Relationship status: Not on file  . Intimate partner violence:    Fear of current or ex  partner: Not on file    Emotionally abused: Not on file    Physically abused: Not on file    Forced sexual activity: Not on file  Other Topics Concern  . Not on file  Social History Narrative   Running and biking this summer.        Outpatient Medications Prior to Visit  Medication Sig Dispense Refill  . lisinopril-hydrochlorothiazide (PRINZIDE,ZESTORETIC) 10-12.5 MG tablet Take 1 tablet by mouth daily. 90 tablet 3  . simvastatin (ZOCOR) 40 MG tablet TAKE 1 TABLET AT BEDTIME 90 tablet 3  . cyclobenzaprine (FLEXERIL) 10 MG tablet Take 1 tablet (10 mg total) by mouth 3 (three) times daily as needed for muscle spasms. 10 tablet 0   No facility-administered medications prior to visit.     Allergies  Allergen Reactions  . Diphenhydramine Hcl     REACTION: hives    ROS Review of Systems    Objective:    Physical Exam  Constitutional: He is oriented to person, place, and time. He appears well-developed and well-nourished.  HENT:  Head: Normocephalic and atraumatic.  Right Ear: External  ear normal.  Left Ear: External ear normal.  Nose: Nose normal.  Mouth/Throat: Oropharynx is clear and moist.  Eyes: Pupils are equal, round, and reactive to light. Conjunctivae and EOM are normal.  Neck: Normal range of motion. Neck supple. No thyromegaly present.  Cardiovascular: Normal rate, regular rhythm, normal heart sounds and intact distal pulses.  Pulmonary/Chest: Effort normal and breath sounds normal.  Abdominal: Soft. Bowel sounds are normal. He exhibits no distension and no mass. There is no tenderness. There is no rebound and no guarding.  Musculoskeletal: Normal range of motion.  Lymphadenopathy:    He has no cervical adenopathy.  Neurological: He is alert and oriented to person, place, and time. He has normal reflexes.  Skin: Skin is warm and dry.  Psychiatric: He has a normal mood and affect. His behavior is normal. Judgment and thought content normal.    BP 128/66   Pulse  68   Ht 5' 10.87" (1.8 m)   Wt 189 lb (85.7 kg)   SpO2 100%   BMI 26.46 kg/m  Wt Readings from Last 3 Encounters:  03/07/18 189 lb (85.7 kg)  03/01/17 186 lb (84.4 kg)  03/21/16 186 lb (84.4 kg)     Health Maintenance Due  Topic Date Due  . HIV Screening  07/29/1984    There are no preventive care reminders to display for this patient.  Lab Results  Component Value Date   TSH 4.501 (H) 07/09/2008   Lab Results  Component Value Date   WBC 6.3 03/21/2016   HGB 15.3 03/21/2016   HCT 44.8 03/21/2016   MCV 88.9 03/21/2016   PLT 287 03/21/2016   Lab Results  Component Value Date   NA 140 03/01/2017   K 4.4 03/01/2017   CO2 29 03/01/2017   GLUCOSE 96 03/01/2017   BUN 15 03/01/2017   CREATININE 0.97 03/01/2017   BILITOT 0.6 03/01/2017   ALKPHOS 91 03/21/2016   AST 27 03/01/2017   ALT 25 03/01/2017   PROT 7.1 03/01/2017   ALBUMIN 4.6 03/21/2016   CALCIUM 9.4 03/01/2017   Lab Results  Component Value Date   CHOL 271 (H) 03/01/2017   Lab Results  Component Value Date   HDL 44 03/01/2017   Lab Results  Component Value Date   LDLCALC 189 (H) 03/01/2017   Lab Results  Component Value Date   TRIG 196 (H) 03/01/2017   Lab Results  Component Value Date   CHOLHDL 6.2 (H) 03/01/2017   Lab Results  Component Value Date   HGBA1C 5.3 09/17/2013      Assessment & Plan:   Problem List Items Addressed This Visit      Cardiovascular and Mediastinum   HYPERTENSION, BENIGN   Relevant Medications   lisinopril-hydrochlorothiazide (PRINZIDE,ZESTORETIC) 10-12.5 MG tablet   simvastatin (ZOCOR) 40 MG tablet     Other   Hyperlipidemia   Relevant Medications   lisinopril-hydrochlorothiazide (PRINZIDE,ZESTORETIC) 10-12.5 MG tablet   simvastatin (ZOCOR) 40 MG tablet    Other Visit Diagnoses    Wellness examination    -  Primary   Relevant Orders   COMPLETE METABOLIC PANEL WITH GFR   Lipid panel   CBC     Keep up a regular exercise program and make sure you  are eating a healthy diet Try to eat 4 servings of dairy a day, or if you are lactose intolerant take a calcium with vitamin D daily.  Your vaccines are up to date.    Meds ordered this  encounter  Medications  . lisinopril-hydrochlorothiazide (PRINZIDE,ZESTORETIC) 10-12.5 MG tablet    Sig: Take 1 tablet by mouth daily.    Dispense:  90 tablet    Refill:  3  . simvastatin (ZOCOR) 40 MG tablet    Sig: TAKE 1 TABLET AT BEDTIME    Dispense:  90 tablet    Refill:  3    Follow-up: Return in about 1 year (around 03/08/2019) for Wellness visit.    Nani Gasseratherine Albaro Deviney, MD

## 2018-03-14 ENCOUNTER — Other Ambulatory Visit: Payer: Self-pay | Admitting: Family Medicine

## 2018-03-14 DIAGNOSIS — R748 Abnormal levels of other serum enzymes: Secondary | ICD-10-CM

## 2018-03-14 NOTE — Progress Notes (Signed)
Re-check liver enzymes, per result note

## 2018-03-19 ENCOUNTER — Other Ambulatory Visit: Payer: Self-pay | Admitting: Family Medicine

## 2018-03-19 DIAGNOSIS — I10 Essential (primary) hypertension: Secondary | ICD-10-CM

## 2018-04-19 ENCOUNTER — Ambulatory Visit (INDEPENDENT_AMBULATORY_CARE_PROVIDER_SITE_OTHER): Payer: BLUE CROSS/BLUE SHIELD | Admitting: Family Medicine

## 2018-04-19 ENCOUNTER — Encounter: Payer: Self-pay | Admitting: Family Medicine

## 2018-04-19 VITALS — BP 124/64 | HR 68 | Ht 71.0 in | Wt 190.0 lb

## 2018-04-19 DIAGNOSIS — B078 Other viral warts: Secondary | ICD-10-CM | POA: Diagnosis not present

## 2018-04-19 DIAGNOSIS — B079 Viral wart, unspecified: Secondary | ICD-10-CM

## 2018-04-19 NOTE — Progress Notes (Signed)
Acute Office Visit  Subjective:    Patient ID: Brett Donaldson, male    DOB: [birth year], 48 y.o.   MRN: [id number]  Chief Complaint  Patient presents with  . Verrucous Vulgaris    R hand at snuff box x 1 yr 2nd wart is on L foot on little toe x 4 yrs he has been using salicylic acid on both sites    HPI Patient is in today for wart removal.  He has one on the right hand bt the thumb and first finger. It has been there for about a year. He has a second on the inside of the 5th toe on his left foot that has been there for about 4 years. It has been bothering him with exercise.  He has been using salicylic acid on it for aobut a year on both.  He says the skin will peel and then bleed but it never seems to go away.    Past Medical History:  Diagnosis Date  . Hypertension     Past Surgical History:  Procedure Laterality Date  . TONSILLECTOMY AND ADENOIDECTOMY      Family History  Problem Relation Age of Onset  . Heart disease Mother 440       5 vaessel bypass  . Hyperlipidemia Mother   . Hypertension Mother 4036       Died age 49, smoker, obesity  . Parkinson's disease Father 9465    Social History   Socioeconomic History  . Marital status: Married    Spouse name: Not on file  . Number of children: Not on file  . Years of education: Not on file  . Highest education level: Not on file  Occupational History  . Not on file  Social Needs  . Financial resource strain: Not on file  . Food insecurity:    Worry: Not on file    Inability: Not on file  . Transportation needs:    Medical: Not on file    Non-medical: Not on file  Tobacco Use  . Smoking status: Never Smoker  . Smokeless tobacco: Never Used  Substance and Sexual Activity  . Alcohol use: Yes  . Drug use: No  . Sexual activity: Not on file  Lifestyle  . Physical activity:    Days per week: Not on file    Minutes per session: Not on file  . Stress: Not on file  Relationships  . Social connections:    Talks  on phone: Not on file    Gets together: Not on file    Attends religious service: Not on file    Active member of club or organization: Not on file    Attends meetings of clubs or organizations: Not on file    Relationship status: Not on file  . Intimate partner violence:    Fear of current or ex partner: Not on file    Emotionally abused: Not on file    Physically abused: Not on file    Forced sexual activity: Not on file  Other Topics Concern  . Not on file  Social History Narrative   Running and biking this summer.        Outpatient Medications Prior to Visit  Medication Sig Dispense Refill  . lisinopril-hydrochlorothiazide (PRINZIDE,ZESTORETIC) 10-12.5 MG tablet TAKE 1 TABLET BY MOUTH EVERY DAY 30 tablet 3  . simvastatin (ZOCOR) 40 MG tablet TAKE 1 TABLET AT BEDTIME 90 tablet 3   No facility-administered medications prior to visit.  Allergies  Allergen Reactions  . Diphenhydramine Hcl     REACTION: hives    ROS     Objective:    Physical Exam  BP 124/64   Pulse 68   Ht 5\' 11"  (1.803 m)   Wt 190 lb (86.2 kg)   SpO2 100%   BMI 26.50 kg/m  Wt Readings from Last 3 Encounters:  04/19/18 190 lb (86.2 kg)  03/07/18 189 lb (85.7 kg)  03/01/17 186 lb (84.4 kg)    Health Maintenance Due  Topic Date Due  . HIV Screening  07/29/1984    There are no preventive care reminders to display for this patient.   Lab Results  Component Value Date   TSH 4.501 (H) 07/09/2008   Lab Results  Component Value Date   WBC 4.7 03/07/2018   HGB 14.6 03/07/2018   HCT 42.6 03/07/2018   MCV 88.9 03/07/2018   PLT 263 03/07/2018   Lab Results  Component Value Date   NA 138 03/07/2018   K 4.1 03/07/2018   CO2 31 03/07/2018   GLUCOSE 94 03/07/2018   BUN 14 03/07/2018   CREATININE 1.12 03/07/2018   BILITOT 0.5 03/07/2018   ALKPHOS 91 03/21/2016   AST 42 (H) 03/07/2018   ALT 54 (H) 03/07/2018   PROT 6.9 03/07/2018   ALBUMIN 4.6 03/21/2016   CALCIUM 9.5 03/07/2018    Lab Results  Component Value Date   CHOL 236 (H) 03/07/2018   Lab Results  Component Value Date   HDL 37 (L) 03/07/2018   Lab Results  Component Value Date   LDLCALC 153 (H) 03/07/2018   Lab Results  Component Value Date   TRIG 279 (H) 03/07/2018   Lab Results  Component Value Date   CHOLHDL 6.4 (H) 03/07/2018   Lab Results  Component Value Date   HGBA1C 5.3 09/17/2013       Assessment & Plan:   Problem List Items Addressed This Visit    None    Visit Diagnoses    Verrucae vulgaris    -  Primary    Treatment options.  He is Brett Donaldson tried the over-the-counter salicylic acid as well as the over-the-counter liquid nitrogen.  Recommend cryotherapy.  We did 3 deep freezes on both lesions.  Patient actually tolerated well.  We will follow-up in 3 to 4 weeks if needed for repeat round of freezing warned that often x1 treatment is not enough to get rid of the wart.   No orders of the defined types were placed in this encounter.  Cryotherapy Procedure Note  Pre-operative Diagnosis: wart   Post-operative Diagnosis: wart  Locations: right and and left foot  Indications: pain and irritation  Anesthesia: not required    Procedure Details  Patient informed of risks (permanent scarring, infection, light or dark discoloration, bleeding, infection, weakness, numbness and recurrence of the lesion) and benefits of the procedure and verbal informed consent obtained.  The areas are treated with liquid nitrogen therapy, frozen until ice ball extended 1-2 mm beyond lesion, allowed to thaw, and treated again. The patient tolerated procedure well.  The patient was instructed on post-op care, warned that there may be blister formation, redness and pain. Recommend OTC analgesia as needed for pain.  Condition: Stable  Complications: none.  Plan: 1. Instructed to keep the area dry and covered for 24-48h and clean thereafter. 2. Warning signs of infection were reviewed.   3.  Recommended that the patient use OTC acetaminophen as needed for pain.  4. Return in 3 weeks if needed for repeat freeze.  [name].   Shabree Tebbetts, MD

## 2018-04-19 NOTE — Patient Instructions (Signed)
Follow up in 3-4 weeks for repeat freeze if needed.

## 2018-10-30 ENCOUNTER — Other Ambulatory Visit: Payer: Self-pay | Admitting: *Deleted

## 2018-10-30 MED ORDER — HYDROCHLOROTHIAZIDE 12.5 MG PO CAPS: 12.5000 mg | ORAL_CAPSULE | Freq: Every day | ORAL | 1 refills | Status: DC

## 2018-10-30 MED ORDER — LISINOPRIL 10 MG PO TABS: 10.0000 mg | ORAL_TABLET | Freq: Every day | ORAL | 1 refills | Status: DC

## 2018-10-30 NOTE — Telephone Encounter (Signed)
Lisinopril/hctz 10-12.5  Is on backorder w/manufacturer will need to split.Marland KitchenMarland KitchenElouise Munroe, [place]

## 2018-12-06 ENCOUNTER — Encounter: Payer: Self-pay | Admitting: Family Medicine

## 2018-12-06 ENCOUNTER — Ambulatory Visit (INDEPENDENT_AMBULATORY_CARE_PROVIDER_SITE_OTHER): Payer: BC Managed Care – PPO | Admitting: Family Medicine

## 2018-12-06 ENCOUNTER — Other Ambulatory Visit: Payer: Self-pay

## 2018-12-06 VITALS — BP 135/84 | HR 64 | Temp 99.6°F | Ht 71.0 in | Wt 184.0 lb

## 2018-12-06 DIAGNOSIS — Z0000 Encounter for general adult medical examination without abnormal findings: Secondary | ICD-10-CM

## 2018-12-06 DIAGNOSIS — Z23 Encounter for immunization: Secondary | ICD-10-CM | POA: Diagnosis not present

## 2018-12-06 DIAGNOSIS — L989 Disorder of the skin and subcutaneous tissue, unspecified: Secondary | ICD-10-CM

## 2018-12-06 DIAGNOSIS — R7989 Other specified abnormal findings of blood chemistry: Secondary | ICD-10-CM | POA: Diagnosis not present

## 2018-12-06 DIAGNOSIS — L820 Inflamed seborrheic keratosis: Secondary | ICD-10-CM | POA: Diagnosis not present

## 2018-12-06 NOTE — Progress Notes (Signed)
Established Patient Office Visit - CPE  Subjective:  Patient ID: Brett Donaldson, male    DOB: [birth year]  Age: 48 y.o. MRN: [id number]  CC:  Chief Complaint  Patient presents with  . Annual Exam  . Nevus    pt has noticed a mole on the L side of his lower back that has gotten larger over the past 3 months  a  HPI Brett Donaldson presents for CPE.  He is doing well overall.  Still very physically active.  He feels like his moods been good.  He is been able to work from home with [employer] so has not been traveling nearly as much and that he is really enjoying that.  His tetanus is up-to-date.  Discussed getting his flu vaccine today.  He did have a lesion on his left mid back that he would like me to take a look at today.  He says that in the last 3 months it has almost doubled in size and not getting caught on clothing and getting irritated.  Past Medical History:  Diagnosis Date  . Hypertension     Past Surgical History:  Procedure Laterality Date  . TONSILLECTOMY AND ADENOIDECTOMY      Family History  Problem Relation Age of Onset  . Heart disease Mother 21       5 vaessel bypass  . Hyperlipidemia Mother   . Hypertension Mother 70       Died age 81, smoker, obesity  . Parkinson's disease Father 72    Social History   Socioeconomic History  . Marital status: Married    Spouse name: Not on file  . Number of children: Not on file  . Years of education: Not on file  . Highest education level: Not on file  Occupational History  . Not on file  Social Needs  . Financial resource strain: Not on file  . Food insecurity    Worry: Not on file    Inability: Not on file  . Transportation needs    Medical: Not on file    Non-medical: Not on file  Tobacco Use  . Smoking status: Never Smoker  . Smokeless tobacco: Never Used  Substance and Sexual Activity  . Alcohol use: Yes  . Drug use: No  . Sexual activity: Not on file  Lifestyle  . Physical activity    Days per  week: Not on file    Minutes per session: Not on file  . Stress: Not on file  Relationships  . Social Herbalist on phone: Not on file    Gets together: Not on file    Attends religious service: Not on file    Active member of club or organization: Not on file    Attends meetings of clubs or organizations: Not on file    Relationship status: Not on file  . Intimate partner violence    Fear of current or ex partner: Not on file    Emotionally abused: Not on file    Physically abused: Not on file    Forced sexual activity: Not on file  Other Topics Concern  . Not on file  Social History Narrative   Running and biking this summer.        Outpatient Medications Prior to Visit  Medication Sig Dispense Refill  . hydrochlorothiazide (MICROZIDE) 12.5 MG capsule Take 1 capsule (12.5 mg total) by mouth daily. 90 capsule 1  . lisinopril (ZESTRIL) 10 MG tablet Take 1  tablet (10 mg total) by mouth daily. 90 tablet 1  . lisinopril-hydrochlorothiazide (PRINZIDE,ZESTORETIC) 10-12.5 MG tablet TAKE 1 TABLET BY MOUTH EVERY DAY 30 tablet 3  . simvastatin (ZOCOR) 40 MG tablet TAKE 1 TABLET AT BEDTIME 90 tablet 3   No facility-administered medications prior to visit.     Allergies  Allergen Reactions  . Diphenhydramine Hcl     REACTION: hives    ROS Review of Systems    Objective:    Physical Exam  Constitutional: He is oriented to person, place, and time. He appears well-developed and well-nourished.  HENT:  Head: Normocephalic and atraumatic.  Right Ear: External ear normal.  Left Ear: External ear normal.  Nose: Nose normal.  Mouth/Throat: Oropharynx is clear and moist.  Eyes: Pupils are equal, round, and reactive to light. Conjunctivae and EOM are normal.  Neck: Normal range of motion. Neck supple. No thyromegaly present.  Cardiovascular: Normal rate, regular rhythm, normal heart sounds and intact distal pulses.  No carotid bruit  Pulmonary/Chest: Effort normal and  breath sounds normal.  Abdominal: Soft. Bowel sounds are normal. He exhibits no distension and no mass. There is no abdominal tenderness. There is no rebound and no guarding.  Musculoskeletal: Normal range of motion.  Lymphadenopathy:    He has no cervical adenopathy.  Neurological: He is alert and oriented to person, place, and time. He has normal reflexes.  Skin: Skin is warm and dry.  6 x 8 mm raised round lesion on the right low back area with a rough surface.  Psychiatric: He has a normal mood and affect. His behavior is normal. Judgment and thought content normal.    BP 135/84   Pulse 64   Temp 99.6 F (37.6 C)   Ht 5\' 11"  (1.803 m)   Wt 184 lb (83.5 kg)   SpO2 100%   BMI 25.66 kg/m  Wt Readings from Last 3 Encounters:  12/06/18 184 lb (83.5 kg)  04/19/18 190 lb (86.2 kg)  03/07/18 189 lb (85.7 kg)     There are no preventive care reminders to display for this patient.  There are no preventive care reminders to display for this patient.  Lab Results  Component Value Date   TSH 4.501 (H) 07/09/2008   Lab Results  Component Value Date   WBC 4.7 03/07/2018   HGB 14.6 03/07/2018   HCT 42.6 03/07/2018   MCV 88.9 03/07/2018   PLT 263 03/07/2018   Lab Results  Component Value Date   NA 138 03/07/2018   K 4.1 03/07/2018   CO2 31 03/07/2018   GLUCOSE 94 03/07/2018   BUN 14 03/07/2018   CREATININE 1.12 03/07/2018   BILITOT 0.5 03/07/2018   ALKPHOS 91 03/21/2016   AST 42 (H) 03/07/2018   ALT 54 (H) 03/07/2018   PROT 6.9 03/07/2018   ALBUMIN 4.6 03/21/2016   CALCIUM 9.5 03/07/2018   Lab Results  Component Value Date   CHOL 236 (H) 03/07/2018   Lab Results  Component Value Date   HDL 37 (L) 03/07/2018   Lab Results  Component Value Date   LDLCALC 153 (H) 03/07/2018   Lab Results  Component Value Date   TRIG 279 (H) 03/07/2018   Lab Results  Component Value Date   CHOLHDL 6.4 (H) 03/07/2018   Lab Results  Component Value Date   HGBA1C 5.3  09/17/2013      Assessment & Plan:   Problem List Items Addressed This Visit    None  Visit Diagnoses    Wellness examination    -  Primary   Relevant Orders   COMPLETE METABOLIC PANEL WITH GFR   Lipid panel   Need for immunization against influenza       Relevant Orders   Flu Vaccine QUAD 36+ mos IM (Completed)   Skin lesion of back       Relevant Orders   Dermatology pathology   Abnormal TSH         Due to recheck Abnormal TSH level.    Keep up a regular exercise program and make sure you are eating a healthy diet Try to eat 4 servings of dairy a day, or if you are lactose intolerant take a calcium with vitamin D daily.  Your vaccines are up to date.   Skin lesion-recommend shave biopsy.  It looks most consistent with a wart like seborrheic keratosis but because it has grown so rapidly in such a short period of time I did recommend shave biopsy for further work-up.  Patient tolerated procedure well.  Shave Biopsy Procedure Note  Pre-operative Diagnosis: Suspicious lesion  Post-operative Diagnosis: normal  Locations:right low back  Indications: irritated, getting larger   Anesthesia: Lidocaine 1% with epinephrine without added sodium bicarbonate  Procedure Details  History of allergy to iodine: no  Patient informed of the risks (including bleeding and infection) and benefits of the  procedure and Verbal informed consent obtained.  The lesion and surrounding area were given a sterile prep using chlorhexidine and draped in the usual sterile fashion. A scalpel was used to shave an area of skin approximately 6mm by 8mm.  Hemostasis achieved with alumuninum chloride. Antibiotic ointment and a sterile dressing applied.  The specimen was sent for pathologic examination. The patient tolerated the procedure well.  EBL: trace ml  Findings: Await path  Condition: Stable  Complications: none.  Plan: 1. Instructed to keep the wound dry and covered for 24-48h and  clean thereafter. 2. Warning signs of infection were reviewed.   3. Recommended that the patient use OTC analgesics as needed for pain.  4. Return PRN  No orders of the defined types were placed in this encounter.   Follow-up: Return in about 1 year (around 12/06/2019) for Wellness Exam .    Nani Gasseratherine Lanyia Jewel, MD

## 2018-12-06 NOTE — Patient Instructions (Signed)
Preventive Care 40-49 Years Old, Male Preventive care refers to lifestyle choices and visits with your health care provider that can promote health and wellness. This includes:  A yearly physical exam. This is also called an annual well check.  Regular dental and eye exams.  Immunizations.  Screening for certain conditions.  Healthy lifestyle choices, such as eating a healthy diet, getting regular exercise, not using drugs or products that contain nicotine and tobacco, and limiting alcohol use. What can I expect for my preventive care visit? Physical exam Your health care provider will check:  Height and weight. These may be used to calculate body mass index (BMI), which is a measurement that tells if you are at a healthy weight.  Heart rate and blood pressure.  Your skin for abnormal spots. Counseling Your health care provider may ask you questions about:  Alcohol, tobacco, and drug use.  Emotional well-being.  Home and relationship well-being.  Sexual activity.  Eating habits.  Work and work environment. What immunizations do I need?  Influenza (flu) vaccine  This is recommended every year. Tetanus, diphtheria, and pertussis (Tdap) vaccine  You may need a Td booster every 10 years. Varicella (chickenpox) vaccine  You may need this vaccine if you have not already been vaccinated. Zoster (shingles) vaccine  You may need this after age 60. Measles, mumps, and rubella (MMR) vaccine  You may need at least one dose of MMR if you were born in 1957 or later. You may also need a second dose. Pneumococcal conjugate (PCV13) vaccine  You may need this if you have certain conditions and were not previously vaccinated. Pneumococcal polysaccharide (PPSV23) vaccine  You may need one or two doses if you smoke cigarettes or if you have certain conditions. Meningococcal conjugate (MenACWY) vaccine  You may need this if you have certain conditions. Hepatitis A vaccine   You may need this if you have certain conditions or if you travel or work in places where you may be exposed to hepatitis A. Hepatitis B vaccine  You may need this if you have certain conditions or if you travel or work in places where you may be exposed to hepatitis B. Haemophilus influenzae type b (Hib) vaccine  You may need this if you have certain risk factors. Human papillomavirus (HPV) vaccine  If recommended by your health care provider, you may need three doses over 6 months. You may receive vaccines as individual doses or as more than one vaccine together in one shot (combination vaccines). Talk with your health care provider about the risks and benefits of combination vaccines. What tests do I need? Blood tests  Lipid and cholesterol levels. These may be checked every 5 years, or more frequently if you are over 49 years old.  Hepatitis C test.  Hepatitis B test. Screening  Lung cancer screening. You may have this screening every year starting at age 49 if you have a 30-pack-year history of smoking and currently smoke or have quit within the past 15 years.  Prostate cancer screening. Recommendations will vary depending on your family history and other risks.  Colorectal cancer screening. All adults should have this screening starting at age 49 and continuing until age 75. Your health care provider may recommend screening at age 45 if you are at increased risk. You will have tests every 1-10 years, depending on your results and the type of screening test.  Diabetes screening. This is done by checking your blood sugar (glucose) after you have not eaten   for a while (fasting). You may have this done every 1-3 years.  Sexually transmitted disease (STD) testing. Follow these instructions at home: Eating and drinking  Eat a diet that includes fresh fruits and vegetables, whole grains, lean protein, and low-fat dairy products.  Take vitamin and mineral supplements as recommended  by your health care provider.  Do not drink alcohol if your health care provider tells you not to drink.  If you drink alcohol: ? Limit how much you have to 0-2 drinks a day. ? Be aware of how much alcohol is in your drink. In the [place] one drink equals one 12 oz bottle of beer (355 mL), one 5 oz glass of wine (148 mL), or one 1 oz glass of hard liquor (44 mL). Lifestyle  Take daily care of your teeth and gums.  Stay active. Exercise for at least 30 minutes on 5 or more days each week.  Do not use any products that contain nicotine or tobacco, such as cigarettes, e-cigarettes, and chewing tobacco. If you need help quitting, ask your health care provider.  If you are sexually active, practice safe sex. Use a condom or other form of protection to prevent STIs (sexually transmitted infections).  Talk with your health care provider about taking a low-dose aspirin every day starting at age 49. What's next?  Go to your health care provider once a year for a well check visit.  Ask your health care provider how often you should have your eyes and teeth checked.  Stay up to date on all vaccines. This information is not intended to replace advice given to you by your health care provider. Make sure you discuss any questions you have with your health care provider. Document Released: 04/17/2015 Document Revised: 03/15/2018 Document Reviewed: 03/15/2018 Elsevier Patient Education  2020 [employer].

## 2018-12-17 DIAGNOSIS — Z1159 Encounter for screening for other viral diseases: Secondary | ICD-10-CM | POA: Diagnosis not present

## 2019-01-12 DIAGNOSIS — Z1159 Encounter for screening for other viral diseases: Secondary | ICD-10-CM | POA: Diagnosis not present

## 2019-03-11 ENCOUNTER — Encounter: Payer: BLUE CROSS/BLUE SHIELD | Admitting: Family Medicine

## 2019-03-22 ENCOUNTER — Other Ambulatory Visit: Payer: Self-pay | Admitting: Family Medicine

## 2019-03-22 DIAGNOSIS — E785 Hyperlipidemia, unspecified: Secondary | ICD-10-CM

## 2019-03-22 DIAGNOSIS — I10 Essential (primary) hypertension: Secondary | ICD-10-CM

## 2019-06-29 ENCOUNTER — Other Ambulatory Visit: Payer: Self-pay | Admitting: Family Medicine

## 2019-06-29 DIAGNOSIS — E785 Hyperlipidemia, unspecified: Secondary | ICD-10-CM

## 2019-06-30 ENCOUNTER — Other Ambulatory Visit: Payer: Self-pay | Admitting: Family Medicine

## 2019-06-30 DIAGNOSIS — I10 Essential (primary) hypertension: Secondary | ICD-10-CM

## 2019-09-09 ENCOUNTER — Ambulatory Visit (INDEPENDENT_AMBULATORY_CARE_PROVIDER_SITE_OTHER): Payer: BC Managed Care – PPO | Admitting: Family Medicine

## 2019-09-09 ENCOUNTER — Encounter: Payer: Self-pay | Admitting: Family Medicine

## 2019-09-09 ENCOUNTER — Ambulatory Visit (INDEPENDENT_AMBULATORY_CARE_PROVIDER_SITE_OTHER): Payer: BC Managed Care – PPO

## 2019-09-09 ENCOUNTER — Other Ambulatory Visit: Payer: Self-pay

## 2019-09-09 VITALS — BP 129/68 | HR 64 | Ht 71.0 in | Wt 190.0 lb

## 2019-09-09 DIAGNOSIS — M7541 Impingement syndrome of right shoulder: Secondary | ICD-10-CM

## 2019-09-09 DIAGNOSIS — G8929 Other chronic pain: Secondary | ICD-10-CM

## 2019-09-09 DIAGNOSIS — M25511 Pain in right shoulder: Secondary | ICD-10-CM

## 2019-09-09 DIAGNOSIS — Z1211 Encounter for screening for malignant neoplasm of colon: Secondary | ICD-10-CM

## 2019-09-09 NOTE — Progress Notes (Signed)
Acute Office Visit  Subjective:    Patient ID: Brett Donaldson, male    DOB: [birth year], 49 y.o.   MRN: [id number]  Chief Complaint  Patient presents with   Shoulder Pain    HPI Patient is in today for Right shoulder pain and limited ROM. Started about a yr ago after chopping wood. Hx of bursitis of that shoulder in 2011. More painful with certain range of motion.  He says throwing a ball overhead.  Pushing something from outside of his body across his body.  Is painful.  He said he went online and has been try to do some exercises on his own for pinched nerve.  He also says he tried taking an NSAID for couple weeks to see if this provided any significant relief but it really did not. No neck pain.   Past Medical History:  Diagnosis Date   Hypertension     Past Surgical History:  Procedure Laterality Date   TONSILLECTOMY AND ADENOIDECTOMY      Family History  Problem Relation Age of Onset   Heart disease Mother 77       5 vaessel bypass   Hyperlipidemia Mother    Hypertension Mother 12       Died age 58, smoker, obesity   Parkinson's disease Father 58    Social History   Socioeconomic History   Marital status: Married    Spouse name: Not on file   Number of children: Not on file   Years of education: Not on file   Highest education level: Not on file  Occupational History   Not on file  Tobacco Use   Smoking status: Never Smoker   Smokeless tobacco: Never Used  Substance and Sexual Activity   Alcohol use: Yes   Drug use: No   Sexual activity: Not on file  Other Topics Concern   Not on file  Social History Narrative   Running and biking this summer.       Social Determinants of Health   Financial Resource Strain:    Difficulty of Paying Living Expenses:   Food Insecurity:    Worried About Charity fundraiser in the Last Year:    Arboriculturist in the Last Year:   Transportation Needs:    Film/video editor (Medical):     Lack of Transportation (Non-Medical):   Physical Activity:    Days of Exercise per Week:    Minutes of Exercise per Session:   Stress:    Feeling of Stress :   Social Connections:    Frequency of Communication with Friends and Family:    Frequency of Social Gatherings with Friends and Family:    Attends Religious Services:    Active Member of Clubs or Organizations:    Attends Music therapist:    Marital Status:   Intimate Partner Violence:    Fear of Current or Ex-Partner:    Emotionally Abused:    Physically Abused:    Sexually Abused:     Outpatient Medications Prior to Visit  Medication Sig Dispense Refill   lisinopril-hydrochlorothiazide (ZESTORETIC) 10-12.5 MG tablet Take 1 tablet by mouth daily.     simvastatin (ZOCOR) 40 MG tablet TAKE 1 TABLET BY MOUTH AT BEDTIME 90 tablet 3   lisinopril-hydrochlorothiazide (ZESTORETIC) 10-12.5 MG tablet TAKE 1 TABLET BY MOUTH EVERY DAY 30 tablet 5   hydrochlorothiazide (MICROZIDE) 12.5 MG capsule Take 1 capsule (12.5 mg total) by mouth daily. 90 capsule 1  lisinopril (ZESTRIL) 10 MG tablet Take 1 tablet (10 mg total) by mouth daily. 90 tablet 1   No facility-administered medications prior to visit.    Allergies  Allergen Reactions   Diphenhydramine Hcl     REACTION: hives    Review of Systems     Objective:    Physical Exam Vitals reviewed.  Constitutional:      Appearance: He is well-developed.  HENT:     Head: Normocephalic and atraumatic.  Eyes:     Conjunctiva/sclera: Conjunctivae normal.  Cardiovascular:     Rate and Rhythm: Normal rate.  Pulmonary:     Effort: Pulmonary effort is normal.  Musculoskeletal:     Comments: Cervical spine with normal flexion extension rotation and side bending.  Right shoulder with NROM.  Weakness with lift off against resistance.  Pain with reaching across and lifting elbow against resistance.  + hawkins.  Nontender over the shoulder joint itself.   And strength is 5-5.  Skin:    General: Skin is dry.     Coloration: Skin is not pale.  Neurological:     Mental Status: He is alert and oriented to person, place, and time.  Psychiatric:        Behavior: Behavior normal.     BP 129/68    Pulse 64    Ht 5\' 11"  (1.803 m)    Wt 190 lb (86.2 kg)    SpO2 100%    BMI 26.50 kg/m  Wt Readings from Last 3 Encounters:  09/09/19 190 lb (86.2 kg)  12/06/18 184 lb (83.5 kg)  04/19/18 190 lb (86.2 kg)    There are no preventive care reminders to display for this patient.  There are no preventive care reminders to display for this patient.   Lab Results  Component Value Date   TSH 4.501 (H) 07/09/2008   Lab Results  Component Value Date   WBC 4.7 03/07/2018   HGB 14.6 03/07/2018   HCT 42.6 03/07/2018   MCV 88.9 03/07/2018   PLT 263 03/07/2018   Lab Results  Component Value Date   NA 138 03/07/2018   K 4.1 03/07/2018   CO2 31 03/07/2018   GLUCOSE 94 03/07/2018   BUN 14 03/07/2018   CREATININE 1.12 03/07/2018   BILITOT 0.5 03/07/2018   ALKPHOS 91 03/21/2016   AST 42 (H) 03/07/2018   ALT 54 (H) 03/07/2018   PROT 6.9 03/07/2018   ALBUMIN 4.6 03/21/2016   CALCIUM 9.5 03/07/2018   Lab Results  Component Value Date   CHOL 236 (H) 03/07/2018   Lab Results  Component Value Date   HDL 37 (L) 03/07/2018   Lab Results  Component Value Date   LDLCALC 153 (H) 03/07/2018   Lab Results  Component Value Date   TRIG 279 (H) 03/07/2018   Lab Results  Component Value Date   CHOLHDL 6.4 (H) 03/07/2018   Lab Results  Component Value Date   HGBA1C 5.3 09/17/2013       Assessment & Plan:   Problem List Items Addressed This Visit    None    Visit Diagnoses    Chronic right shoulder pain    -  Primary   Relevant Orders   DG Shoulder Right   Ambulatory referral to Physical Therapy   Impingement syndrome of right shoulder       Relevant Orders   DG Shoulder Right   Ambulatory referral to Physical Therapy   Screening  for malignant neoplasm of colon  Relevant Orders   Ambulatory referral to Gastroenterology     Chronic right shoulder pain suspect impingement syndrome based on exam and history today.  The also think he has some weakness of the supraspinatus muscle.  Recommend x-rays today for further evaluation says is been going on for a year as well as formal physical therapy.  Not improving after 6 weeks consider MRI for further evaluation.  Did not recommend NSAIDs today since he is already tried this.  No orders of the defined types were placed in this encounter.    [name], MD

## 2019-12-16 DIAGNOSIS — K640 First degree hemorrhoids: Secondary | ICD-10-CM | POA: Diagnosis not present

## 2019-12-16 DIAGNOSIS — Z1211 Encounter for screening for malignant neoplasm of colon: Secondary | ICD-10-CM | POA: Diagnosis not present

## 2019-12-16 DIAGNOSIS — D125 Benign neoplasm of sigmoid colon: Secondary | ICD-10-CM | POA: Diagnosis not present

## 2019-12-16 LAB — HM COLONOSCOPY

## 2019-12-31 ENCOUNTER — Other Ambulatory Visit: Payer: Self-pay | Admitting: Family Medicine

## 2019-12-31 DIAGNOSIS — I10 Essential (primary) hypertension: Secondary | ICD-10-CM

## 2020-01-28 ENCOUNTER — Other Ambulatory Visit: Payer: Self-pay | Admitting: Family Medicine

## 2020-01-28 DIAGNOSIS — I10 Essential (primary) hypertension: Secondary | ICD-10-CM

## 2020-02-07 DIAGNOSIS — Z20822 Contact with and (suspected) exposure to covid-19: Secondary | ICD-10-CM | POA: Diagnosis not present

## 2020-02-07 DIAGNOSIS — Z1152 Encounter for screening for COVID-19: Secondary | ICD-10-CM | POA: Diagnosis not present

## 2020-03-02 ENCOUNTER — Other Ambulatory Visit: Payer: Self-pay | Admitting: Family Medicine

## 2020-03-02 DIAGNOSIS — I10 Essential (primary) hypertension: Secondary | ICD-10-CM

## 2020-03-19 ENCOUNTER — Other Ambulatory Visit: Payer: Self-pay | Admitting: Family Medicine

## 2020-03-19 DIAGNOSIS — I10 Essential (primary) hypertension: Secondary | ICD-10-CM

## 2020-04-01 ENCOUNTER — Other Ambulatory Visit: Payer: Self-pay

## 2020-04-01 ENCOUNTER — Encounter: Payer: Self-pay | Admitting: Family Medicine

## 2020-04-01 ENCOUNTER — Ambulatory Visit (INDEPENDENT_AMBULATORY_CARE_PROVIDER_SITE_OTHER): Payer: BC Managed Care – PPO | Admitting: Family Medicine

## 2020-04-01 VITALS — BP 120/66 | HR 62 | Ht 71.0 in | Wt 194.0 lb

## 2020-04-01 DIAGNOSIS — Z0000 Encounter for general adult medical examination without abnormal findings: Secondary | ICD-10-CM | POA: Diagnosis not present

## 2020-04-01 DIAGNOSIS — Z23 Encounter for immunization: Secondary | ICD-10-CM

## 2020-04-01 DIAGNOSIS — Z125 Encounter for screening for malignant neoplasm of prostate: Secondary | ICD-10-CM

## 2020-04-01 NOTE — Progress Notes (Signed)
Established Patient Office Visit  Subjective:  Patient ID: Brett Donaldson, male    DOB: April 15, 1970  Age: 49 y.o. MRN: [id number]  CC:  Chief Complaint  Patient presents with  . Annual Exam    HPI Brett Donaldson presents for CPE.  He is doing well overall.  He exercises regularly.  Taking blood pressure medications regularly.  He is due for labs.  He did not go in September.  Review of systems is negative.  Past Medical History:  Diagnosis Date  . Hypertension     Past Surgical History:  Procedure Laterality Date  . TONSILLECTOMY AND ADENOIDECTOMY      Family History  Problem Relation Age of Onset  . Heart disease Mother 73       5 vaessel bypass  . Hyperlipidemia Mother   . Hypertension Mother 33       Died age 53, smoker, obesity  . Parkinson's disease Father 67    Social History   Socioeconomic History  . Marital status: Married    Spouse name: Not on file  . Number of children: Not on file  . Years of education: Not on file  . Highest education level: Not on file  Occupational History  . Not on file  Tobacco Use  . Smoking status: Never Smoker  . Smokeless tobacco: Never Used  Substance and Sexual Activity  . Alcohol use: Yes  . Drug use: No  . Sexual activity: Not on file  Other Topics Concern  . Not on file  Social History Narrative   Running and biking this summer.       Social Determinants of Health   Financial Resource Strain: Not on file  Food Insecurity: Not on file  Transportation Needs: Not on file  Physical Activity: Not on file  Stress: Not on file  Social Connections: Not on file  Intimate Partner Violence: Not on file    Outpatient Medications Prior to Visit  Medication Sig Dispense Refill  . lisinopril-hydrochlorothiazide (ZESTORETIC) 10-12.5 MG tablet Take 1 tablet by mouth daily. Needs an appt and labs for refills 7 tablet 0  . simvastatin (ZOCOR) 40 MG tablet TAKE 1 TABLET BY MOUTH AT BEDTIME 90 tablet 3   No  facility-administered medications prior to visit.    Allergies  Allergen Reactions  . Diphenhydramine Hcl     REACTION: hives    ROS Review of Systems    Objective:    Physical Exam Constitutional:      Appearance: He is well-developed and well-nourished.  HENT:     Head: Normocephalic and atraumatic.     Right Ear: External ear normal.     Left Ear: External ear normal.     Nose: Nose normal.     Mouth/Throat:     Mouth: Oropharynx is clear and moist.  Eyes:     Extraocular Movements: EOM normal.     Conjunctiva/sclera: Conjunctivae normal.     Pupils: Pupils are equal, round, and reactive to light.  Neck:     Thyroid: No thyromegaly.  Cardiovascular:     Rate and Rhythm: Normal rate and regular rhythm.     Pulses: Intact distal pulses.     Heart sounds: Normal heart sounds.  Pulmonary:     Effort: Pulmonary effort is normal.     Breath sounds: Normal breath sounds.  Abdominal:     General: Bowel sounds are normal. There is no distension.     Palpations: Abdomen is soft. There is no  mass.     Tenderness: There is no abdominal tenderness. There is no guarding or rebound.  Musculoskeletal:        General: Normal range of motion.     Cervical back: Normal range of motion and neck supple.  Lymphadenopathy:     Cervical: No cervical adenopathy.  Skin:    General: Skin is warm and dry.  Neurological:     Mental Status: He is alert and oriented to person, place, and time.     Deep Tendon Reflexes: Reflexes are normal and symmetric.  Psychiatric:        Mood and Affect: Mood and affect normal.        Behavior: Behavior normal.        Thought Content: Thought content normal.        Judgment: Judgment normal.     BP 120/66   Pulse 62   Ht 5\' 11"  (1.803 m)   Wt 194 lb (88 kg)   SpO2 100%   BMI 27.06 kg/m  Wt Readings from Last 3 Encounters:  04/01/20 194 lb (88 kg)  09/09/19 190 lb (86.2 kg)  12/06/18 184 lb (83.5 kg)     Health Maintenance Due  Topic  Date Due  . Hepatitis C Screening  Never done  . HIV Screening  Never done    There are no preventive care reminders to display for this patient.  Lab Results  Component Value Date   TSH 4.501 (H) 07/09/2008   Lab Results  Component Value Date   WBC 4.7 03/07/2018   HGB 14.6 03/07/2018   HCT 42.6 03/07/2018   MCV 88.9 03/07/2018   PLT 263 03/07/2018   Lab Results  Component Value Date   NA 138 03/07/2018   K 4.1 03/07/2018   CO2 31 03/07/2018   GLUCOSE 94 03/07/2018   BUN 14 03/07/2018   CREATININE 1.12 03/07/2018   BILITOT 0.5 03/07/2018   ALKPHOS 91 03/21/2016   AST 42 (H) 03/07/2018   ALT 54 (H) 03/07/2018   PROT 6.9 03/07/2018   ALBUMIN 4.6 03/21/2016   CALCIUM 9.5 03/07/2018   Lab Results  Component Value Date   CHOL 236 (H) 03/07/2018   Lab Results  Component Value Date   HDL 37 (L) 03/07/2018   Lab Results  Component Value Date   LDLCALC 153 (H) 03/07/2018   Lab Results  Component Value Date   TRIG 279 (H) 03/07/2018   Lab Results  Component Value Date   CHOLHDL 6.4 (H) 03/07/2018   Lab Results  Component Value Date   HGBA1C 5.3 09/17/2013      Assessment & Plan:   Problem List Items Addressed This Visit   None   Visit Diagnoses    Wellness examination    -  Primary   Relevant Orders   Lipid panel   COMPLETE METABOLIC PANEL WITH GFR   PSA   Varicella-zoster vaccine IM (Shingrix) (Completed)   Screening for prostate cancer       Relevant Orders   PSA   Need for Zostavax administration       Relevant Orders   Varicella-zoster vaccine IM (Shingrix) (Completed)     Keep up a regular exercise program and make sure you are eating a healthy diet Try to eat 4 servings of dairy a day, or if you are lactose intolerant take a calcium with vitamin D daily.  Your vaccines are up to date.  Due for labs.  Call once with results  once available. Start PSA screening. Colonoscopy is up-to-date. Even for shingles vaccine today. Urged him to  schedule first eye appointment this year.     No orders of the defined types were placed in this encounter.   Follow-up: Return in about 6 months (around 09/30/2020) for Hypertension.    [name], MD

## 2020-06-29 ENCOUNTER — Other Ambulatory Visit: Payer: Self-pay | Admitting: Family Medicine

## 2020-06-29 DIAGNOSIS — E785 Hyperlipidemia, unspecified: Secondary | ICD-10-CM

## 2020-09-08 DIAGNOSIS — S90862A Insect bite (nonvenomous), left foot, initial encounter: Secondary | ICD-10-CM | POA: Diagnosis not present

## 2020-09-08 DIAGNOSIS — W57XXXA Bitten or stung by nonvenomous insect and other nonvenomous arthropods, initial encounter: Secondary | ICD-10-CM | POA: Diagnosis not present

## 2020-09-08 DIAGNOSIS — L089 Local infection of the skin and subcutaneous tissue, unspecified: Secondary | ICD-10-CM | POA: Diagnosis not present

## 2020-10-23 IMAGING — DX DG SHOULDER 2+V*R*
3 series · 3 of 3 positions shown · non-contrast
Comparison: None.

CLINICAL DATA: Right shoulder pain with certain movements for 1
year.

EXAM:
RIGHT SHOULDER - 2+ VIEW

[shoulder grashey]
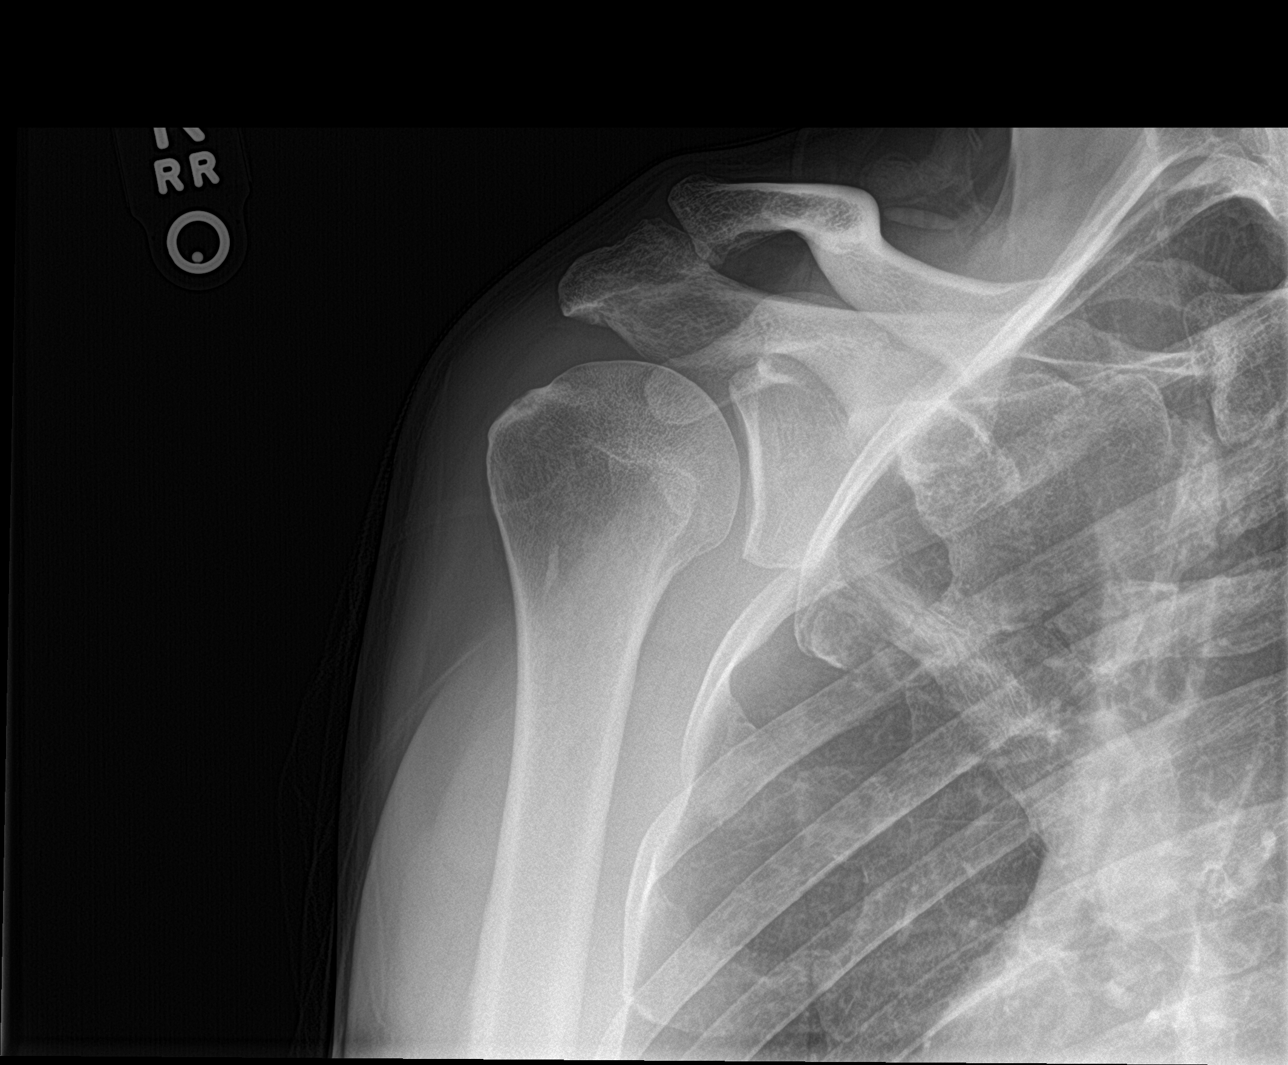

[shoulder y view]
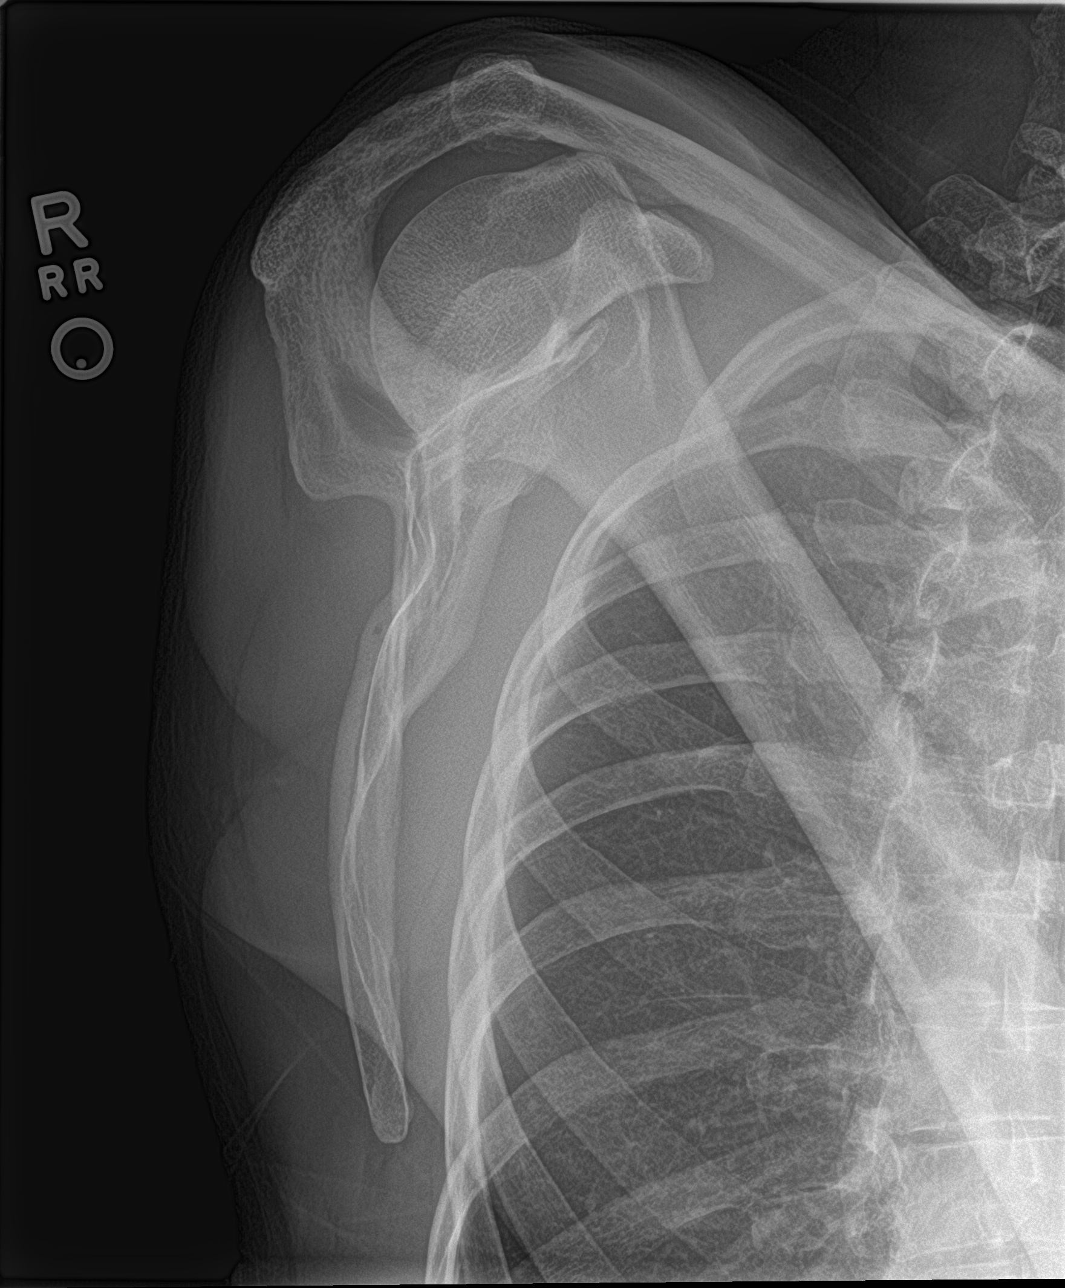

[shoulder axillary]
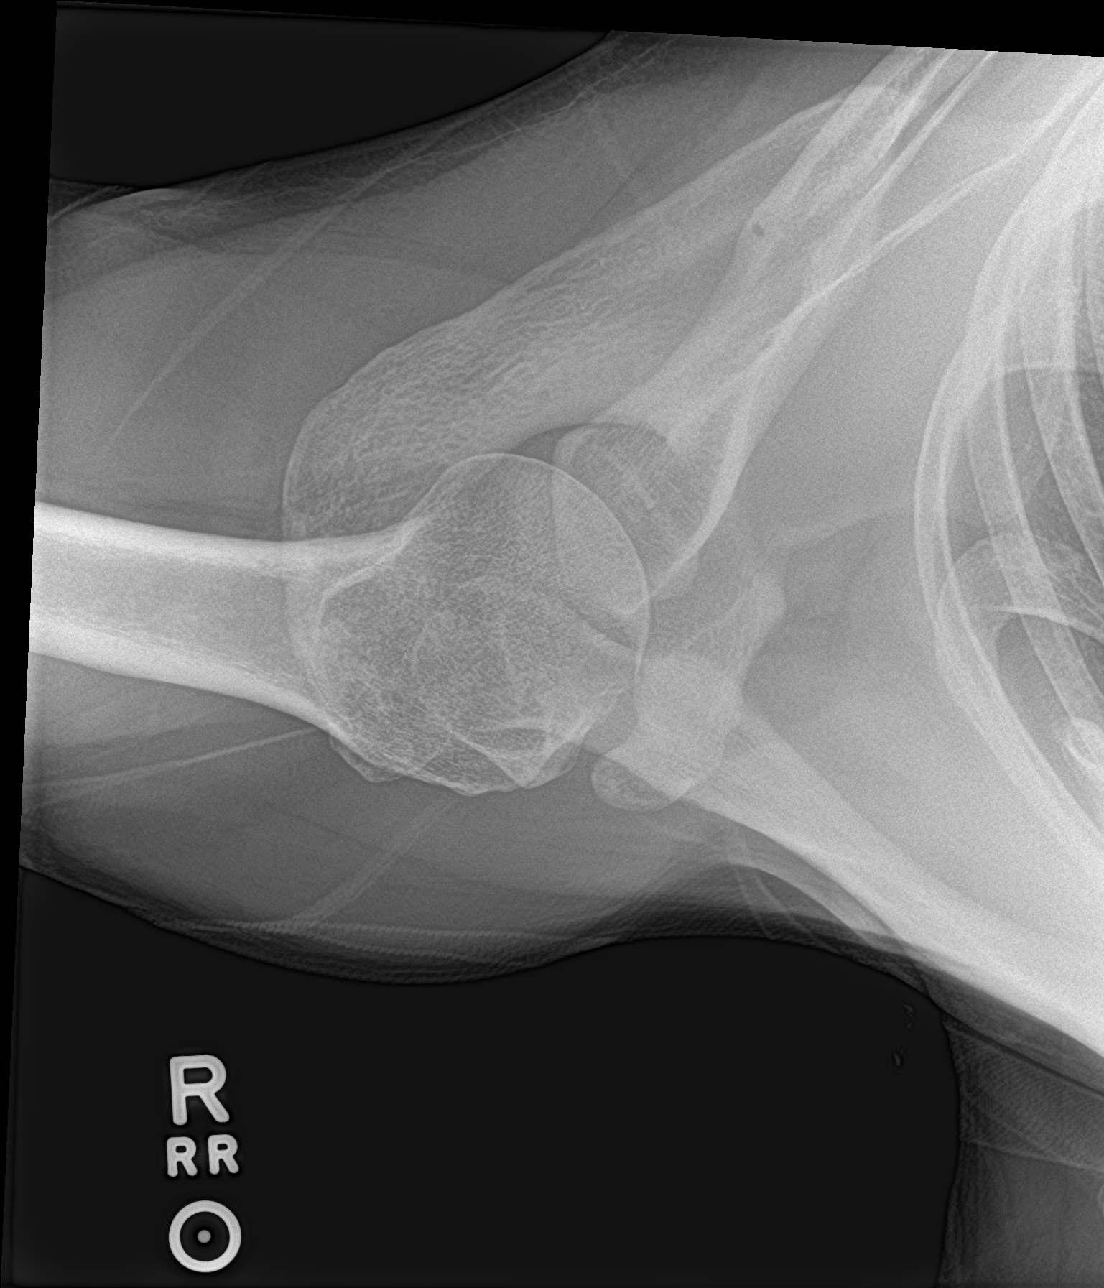

[3 of 3 positions shown; findings below may reference images not displayed]

FINDINGS: No fracture or bone lesion. Glenohumeral joint normally spaced and
aligned. No arthropathic changes. Small marginal osteophytes noted
the AC joint. AC joint is normally aligned.

Normal soft tissues.
IMPRESSION: 1. No fracture or acute finding.
2. Minor AC joint osteoarthritis.  No other abnormality.

## 2020-11-17 ENCOUNTER — Encounter: Payer: BC Managed Care – PPO | Admitting: Family Medicine

## 2020-11-17 DIAGNOSIS — Z125 Encounter for screening for malignant neoplasm of prostate: Secondary | ICD-10-CM

## 2020-11-17 DIAGNOSIS — Z5320 Procedure and treatment not carried out because of patient's decision for unspecified reasons: Secondary | ICD-10-CM

## 2020-11-17 DIAGNOSIS — Z0000 Encounter for general adult medical examination without abnormal findings: Secondary | ICD-10-CM

## 2020-11-17 DIAGNOSIS — Z1159 Encounter for screening for other viral diseases: Secondary | ICD-10-CM

## 2020-11-17 DIAGNOSIS — Z114 Encounter for screening for human immunodeficiency virus [HIV]: Secondary | ICD-10-CM

## 2020-12-14 ENCOUNTER — Encounter: Payer: Self-pay | Admitting: Family Medicine

## 2020-12-14 ENCOUNTER — Ambulatory Visit (INDEPENDENT_AMBULATORY_CARE_PROVIDER_SITE_OTHER): Payer: BC Managed Care – PPO | Admitting: Family Medicine

## 2020-12-14 VITALS — BP 135/74 | HR 72 | Ht 71.0 in | Wt 190.1 lb

## 2020-12-14 DIAGNOSIS — Z0000 Encounter for general adult medical examination without abnormal findings: Secondary | ICD-10-CM | POA: Diagnosis not present

## 2020-12-14 DIAGNOSIS — Z125 Encounter for screening for malignant neoplasm of prostate: Secondary | ICD-10-CM

## 2020-12-14 DIAGNOSIS — Z23 Encounter for immunization: Secondary | ICD-10-CM | POA: Diagnosis not present

## 2020-12-14 DIAGNOSIS — I10 Essential (primary) hypertension: Secondary | ICD-10-CM

## 2020-12-14 MED ORDER — LISINOPRIL-HYDROCHLOROTHIAZIDE 10-12.5 MG PO TABS: 1.0000 | ORAL_TABLET | Freq: Every day | ORAL | 3 refills | Status: DC

## 2020-12-14 NOTE — Progress Notes (Signed)
Established Patient Office Visit  Subjective:  Patient ID: Brett Donaldson, male    DOB: 1971/03/06  Age: 50 y.o. MRN: [id number]  CC:  Chief Complaint  Patient presents with   Annual Exam    HPI Brett Donaldson presents for CPE.   Past Medical History:  Diagnosis Date   Hypertension     Past Surgical History:  Procedure Laterality Date   TONSILLECTOMY AND ADENOIDECTOMY      Family History  Problem Relation Age of Onset   Heart disease Mother 45       5 vaessel bypass   Hyperlipidemia Mother    Hypertension Mother 68       Died age 65, smoker, obesity   Parkinson's disease Father 52    Social History   Socioeconomic History   Marital status: Married    Spouse name: Not on file   Number of children: Not on file   Years of education: Not on file   Highest education level: Not on file  Occupational History   Not on file  Tobacco Use   Smoking status: Never   Smokeless tobacco: Never  Substance and Sexual Activity   Alcohol use: Yes   Drug use: No   Sexual activity: Not on file  Other Topics Concern   Not on file  Social History Narrative   Running and biking this summer.       Social Determinants of Health   Financial Resource Strain: Not on file  Food Insecurity: Not on file  Transportation Needs: Not on file  Physical Activity: Not on file  Stress: Not on file  Social Connections: Not on file  Intimate Partner Violence: Not on file    Outpatient Medications Prior to Visit  Medication Sig Dispense Refill   simvastatin (ZOCOR) 40 MG tablet TAKE 1 TABLET BY MOUTH EVERYDAY AT BEDTIME 90 tablet 3   lisinopril-hydrochlorothiazide (ZESTORETIC) 10-12.5 MG tablet Take 1 tablet by mouth daily. Needs an appt and labs for refills 7 tablet 0   No facility-administered medications prior to visit.    Allergies  Allergen Reactions   Diphenhydramine Hcl     REACTION: hives    ROS Review of Systems    Objective:    Physical Exam Constitutional:       Appearance: He is well-developed.  HENT:     Head: Normocephalic and atraumatic.     Right Ear: External ear normal.     Left Ear: External ear normal.     Nose: Nose normal.  Eyes:     Conjunctiva/sclera: Conjunctivae normal.     Pupils: Pupils are equal, round, and reactive to light.  Neck:     Thyroid: No thyromegaly.  Cardiovascular:     Rate and Rhythm: Normal rate and regular rhythm.     Heart sounds: Normal heart sounds.  Pulmonary:     Effort: Pulmonary effort is normal.     Breath sounds: Normal breath sounds.  Abdominal:     General: Bowel sounds are normal. There is no distension.     Palpations: Abdomen is soft. There is no mass.     Tenderness: There is no abdominal tenderness. There is no guarding or rebound.  Musculoskeletal:        General: Normal range of motion.     Cervical back: Normal range of motion and neck supple.  Lymphadenopathy:     Cervical: No cervical adenopathy.  Skin:    General: Skin is warm and dry.  Neurological:  Mental Status: He is alert and oriented to person, place, and time.     Deep Tendon Reflexes: Reflexes are normal and symmetric.  Psychiatric:        Behavior: Behavior normal.        Thought Content: Thought content normal.        Judgment: Judgment normal.    BP 135/74   Pulse 72   Ht 5\' 11"  (1.803 m)   Wt 190 lb 1.3 oz (86.2 kg)   SpO2 100%   BMI 26.51 kg/m  Wt Readings from Last 3 Encounters:  12/14/20 190 lb 1.3 oz (86.2 kg)  04/01/20 194 lb (88 kg)  09/09/19 190 lb (86.2 kg)     Health Maintenance Due  Topic Date Due   Hepatitis C Screening  Never done    There are no preventive care reminders to display for this patient.  Lab Results  Component Value Date   TSH 4.501 (H) 07/09/2008   Lab Results  Component Value Date   WBC 4.7 03/07/2018   HGB 14.6 03/07/2018   HCT 42.6 03/07/2018   MCV 88.9 03/07/2018   PLT 263 03/07/2018   Lab Results  Component Value Date   NA 138 03/07/2018   K 4.1  03/07/2018   CO2 31 03/07/2018   GLUCOSE 94 03/07/2018   BUN 14 03/07/2018   CREATININE 1.12 03/07/2018   BILITOT 0.5 03/07/2018   ALKPHOS 91 03/21/2016   AST 42 (H) 03/07/2018   ALT 54 (H) 03/07/2018   PROT 6.9 03/07/2018   ALBUMIN 4.6 03/21/2016   CALCIUM 9.5 03/07/2018   Lab Results  Component Value Date   CHOL 236 (H) 03/07/2018   Lab Results  Component Value Date   HDL 37 (L) 03/07/2018   Lab Results  Component Value Date   LDLCALC 153 (H) 03/07/2018   Lab Results  Component Value Date   TRIG 279 (H) 03/07/2018   Lab Results  Component Value Date   CHOLHDL 6.4 (H) 03/07/2018   Lab Results  Component Value Date   HGBA1C 5.3 09/17/2013      Assessment & Plan:   Problem List Items Addressed This Visit   None Visit Diagnoses     Wellness examination    -  Primary   Essential (primary) hypertension       Relevant Medications   lisinopril-hydrochlorothiazide (ZESTORETIC) 10-12.5 MG tablet   Other Relevant Orders   PSA   Lipid panel   COMPLETE METABOLIC PANEL WITH GFR   CBC   Need for Zostavax administration       Relevant Orders   Varicella-zoster vaccine IM (Shingrix) (Completed)   Need for immunization against influenza       Relevant Orders   Flu Vaccine QUAD 17mo+IM (Fluarix, Fluzone & Alfiuria Quad PF) (Completed)   Screening for prostate cancer       Relevant Orders   PSA      Keep up a regular exercise program and make sure you are eating a healthy diet Try to eat 4 servings of dairy a day, or if you are lactose intolerant take a calcium with vitamin D daily.  Your vaccines are up to date.    Meds ordered this encounter  Medications   lisinopril-hydrochlorothiazide (ZESTORETIC) 10-12.5 MG tablet    Sig: Take 1 tablet by mouth daily.    Dispense:  90 tablet    Refill:  3    Follow-up: Return in about 6 months (around 06/13/2021) for Hypertension.  Beatrice Lecher, MD

## 2020-12-15 LAB — LIPID PANEL
Cholesterol: 228 mg/dL — ABNORMAL HIGH (ref <200)
HDL: 42 mg/dL (ref >=40)
LDL Cholesterol (Calc): 146 mg/dL (calc) — ABNORMAL HIGH
Non-HDL Cholesterol (Calc): 186 mg/dL (calc) — ABNORMAL HIGH (ref <130)
Total CHOL/HDL Ratio: 5.4 (calc) — ABNORMAL HIGH (ref <5.0)
Triglycerides: 259 mg/dL — ABNORMAL HIGH (ref <150)

## 2020-12-15 LAB — COMPLETE METABOLIC PANEL WITH GFR
AG Ratio: 1.8 (calc) (ref 1.0–2.5)
ALT: 30 U/L (ref 9–46)
AST: 31 U/L (ref 10–35)
Albumin: 4.6 g/dL (ref 3.6–5.1)
Alkaline phosphatase (APISO): 71 U/L (ref 35–144)
BUN: 17 mg/dL (ref 7–25)
CO2: 27 mmol/L (ref 20–32)
Calcium: 9.8 mg/dL (ref 8.6–10.3)
Chloride: 104 mmol/L (ref 98–110)
Creat: 1.1 mg/dL (ref 0.70–1.30)
Globulin: 2.6 g/dL (calc) (ref 1.9–3.7)
Glucose, Bld: 85 mg/dL (ref 65–99)
Potassium: 5 mmol/L (ref 3.5–5.3)
Sodium: 139 mmol/L (ref 135–146)
Total Bilirubin: 0.7 mg/dL (ref 0.2–1.2)
Total Protein: 7.2 g/dL (ref 6.1–8.1)
eGFR: 81 mL/min/{1.73_m2} (ref >=60)

## 2020-12-15 LAB — CBC
HCT: 45 % (ref 38.5–50.0)
Hemoglobin: 15 g/dL (ref 13.2–17.1)
MCH: 30.1 pg (ref 27.0–33.0)
MCHC: 33.3 g/dL (ref 32.0–36.0)
MCV: 90.4 fL (ref 80.0–100.0)
MPV: 11.5 fL (ref 7.5–12.5)
Platelets: 238 10*3/uL (ref 140–400)
RBC: 4.98 10*6/uL (ref 4.20–5.80)
RDW: 13.1 % (ref 11.0–15.0)
WBC: 4.9 10*3/uL (ref 3.8–10.8)

## 2020-12-15 LAB — PSA: PSA: 0.57 ng/mL (ref <=4.00)

## 2020-12-16 NOTE — Progress Notes (Signed)
Call patient: Prostate test is normal.  Cholesterol still elevated but does look better this year great work and bringing that down.  Liver enzymes are back to normal.  Blood count is normal.  The 10-year ASCVD risk score (Arnett DK, et al., 2019) is: 7%   Values used to calculate the score:     Age: 50 years     Sex: Male     Is Non-Hispanic African American: No     Diabetic: No     Tobacco smoker: No     Systolic Blood Pressure: 135 mmHg     Is BP treated: Yes     HDL Cholesterol: 42 mg/dL     Total Cholesterol: 228 mg/dL

## 2021-06-30 ENCOUNTER — Other Ambulatory Visit: Payer: Self-pay | Admitting: Family Medicine

## 2021-06-30 DIAGNOSIS — E785 Hyperlipidemia, unspecified: Secondary | ICD-10-CM

## 2022-01-04 ENCOUNTER — Other Ambulatory Visit: Payer: Self-pay | Admitting: Family Medicine

## 2022-01-04 DIAGNOSIS — I10 Essential (primary) hypertension: Secondary | ICD-10-CM

## 2022-01-04 NOTE — Telephone Encounter (Signed)
Please call pt he will need an OV and fasting labs TY.

## 2022-01-04 NOTE — Telephone Encounter (Signed)
He's scheduled for 01/20/2022 at 11:00- tvt

## 2022-01-20 ENCOUNTER — Encounter: Payer: Self-pay | Admitting: Family Medicine

## 2022-01-20 ENCOUNTER — Ambulatory Visit (INDEPENDENT_AMBULATORY_CARE_PROVIDER_SITE_OTHER): Payer: BC Managed Care – PPO | Admitting: Family Medicine

## 2022-01-20 VITALS — BP 148/85 | HR 91 | Resp 20 | Ht 71.0 in | Wt 190.0 lb

## 2022-01-20 DIAGNOSIS — Z0000 Encounter for general adult medical examination without abnormal findings: Secondary | ICD-10-CM

## 2022-01-20 DIAGNOSIS — I10 Essential (primary) hypertension: Secondary | ICD-10-CM

## 2022-01-20 DIAGNOSIS — E785 Hyperlipidemia, unspecified: Secondary | ICD-10-CM

## 2022-01-20 MED ORDER — SIMVASTATIN 40 MG PO TABS: ORAL_TABLET | ORAL | 3 refills | Status: DC

## 2022-01-20 MED ORDER — LISINOPRIL-HYDROCHLOROTHIAZIDE 10-12.5 MG PO TABS: 1.0000 | ORAL_TABLET | Freq: Every day | ORAL | 3 refills | Status: DC

## 2022-01-20 NOTE — Progress Notes (Signed)
Complete physical exam  Patient: Brett Donaldson   DOB: [birth year]   51 y.o. Male  MRN: [id number]  Subjective:    Chief Complaint  Patient presents with   Annual Exam    Brett Donaldson is a 51 y.o. male who presents today for a complete physical exam. He reports consuming a general diet.  Goes to the gym 3 days/week and walks daily for exercise.  He generally feels well. He reports sleeping fairly well. He does not have additional problems to discuss today.    Most recent fall risk assessment:    01/20/2022   11:20 AM  [place] in the past year? 0  Number falls in past yr: 0  Injury with Fall? 0  Risk for fall due to : No Fall Risks  Follow up Falls evaluation completed     Most recent depression screenings:    01/20/2022   11:21 AM 04/01/2020   11:19 AM  PHQ 2/9 Scores  PHQ - 2 Score 0 0      Past Medical History:  Diagnosis Date   Hypertension    Past Surgical History:  Procedure Laterality Date   TONSILLECTOMY AND ADENOIDECTOMY     Family History  Problem Relation Age of Onset   Heart disease Mother 32       5 vaessel bypass   Hyperlipidemia Mother    Hypertension Mother 51       Died age 24, smoker, obesity   Parkinson's disease Father 83      Patient Care Team: Hali Marry, MD as PCP - General   Outpatient Medications Prior to Visit  Medication Sig   [DISCONTINUED] lisinopril-hydrochlorothiazide (ZESTORETIC) 10-12.5 MG tablet TAKE 1 TABLET BY MOUTH EVERY DAY   [DISCONTINUED] simvastatin (ZOCOR) 40 MG tablet TAKE 1 TABLET BY MOUTH EVERYDAY AT BEDTIME   No facility-administered medications prior to visit.    ROS        Objective:     BP (!) 148/85   Pulse 91   Resp 20   Ht 5\' 11"  (1.803 m)   Wt 190 lb (86.2 kg)   SpO2 99%   BMI 26.50 kg/m     Physical Exam Constitutional:      Appearance: He is well-developed.  HENT:     Head: Normocephalic and atraumatic.     Right Ear: Tympanic membrane, ear canal  and external ear normal.     Left Ear: Tympanic membrane, ear canal and external ear normal.     Nose: Nose normal.     Mouth/Throat:     Pharynx: Oropharynx is clear.  Eyes:     Conjunctiva/sclera: Conjunctivae normal.     Pupils: Pupils are equal, round, and reactive to light.  Neck:     Thyroid: No thyromegaly.  Cardiovascular:     Rate and Rhythm: Normal rate and regular rhythm.     Heart sounds: Normal heart sounds.  Pulmonary:     Effort: Pulmonary effort is normal.     Breath sounds: Normal breath sounds.  Abdominal:     General: Bowel sounds are normal. There is no distension.     Palpations: Abdomen is soft. There is no mass.     Tenderness: There is no abdominal tenderness. There is no guarding or rebound.  Musculoskeletal:        General: Normal range of motion.     Cervical back: Normal range of motion and neck supple. No tenderness.  Lymphadenopathy:  Cervical: No cervical adenopathy.  Skin:    General: Skin is warm and dry.  Neurological:     Mental Status: He is alert and oriented to person, place, and time.     Deep Tendon Reflexes: Reflexes are normal and symmetric.  Psychiatric:        Behavior: Behavior normal.        Thought Content: Thought content normal.        Judgment: Judgment normal.      No results found for any visits on 01/20/22.      Assessment & Plan:    Routine Health Maintenance and Physical Exam  Immunization History  Administered Date(s) Administered   Influenza, Seasonal, Injecte, Preservative Fre 01/17/2013   Influenza,inj,Quad PF,6+ Mos 12/29/2014, 03/21/2016, 03/01/2017, 12/06/2018, 12/14/2020   Influenza-Unspecified 01/26/2018, 01/16/2020, 01/17/2022   Td 10/24/2006   Tdap 03/01/2017   Zoster Recombinat (Shingrix) 04/01/2020, 12/14/2020    Health Maintenance  Topic Date Due   Hepatitis C Screening  01/21/2023 (Originally 07/30/1987)   HIV Screening  01/21/2023 (Originally 07/29/1984)   COLONOSCOPY (Pts 45-21yrs  Insurance coverage will need to be confirmed)  12/16/2022   TETANUS/TDAP  03/02/2027   INFLUENZA VACCINE  Completed   Zoster Vaccines- Shingrix  Completed   Pneumococcal Vaccine 65-8 Years old  Aged Out   HPV VACCINES  Aged Out    Discussed health benefits of physical activity, and encouraged him to engage in regular exercise appropriate for his age and condition.  Problem List Items Addressed This Visit       Cardiovascular and Mediastinum   HYPERTENSION, BENIGN    Repeat  blood pressure was elevated today so encouraged him to come back in 2 weeks to recheck if it is still elevated we may need to make an adjustment on his blood pressure regimen.      Relevant Medications   lisinopril-hydrochlorothiazide (ZESTORETIC) 10-12.5 MG tablet   simvastatin (ZOCOR) 40 MG tablet     Other   Hyperlipidemia   Relevant Medications   lisinopril-hydrochlorothiazide (ZESTORETIC) 10-12.5 MG tablet   simvastatin (ZOCOR) 40 MG tablet   Other Relevant Orders   PSA   Lipid panel   COMPLETE METABOLIC PANEL WITH GFR   CBC   Other Visit Diagnoses     Wellness examination    -  Primary   Relevant Orders   PSA   Lipid panel   COMPLETE METABOLIC PANEL WITH GFR   CBC   Essential (primary) hypertension       Relevant Medications   lisinopril-hydrochlorothiazide (ZESTORETIC) 10-12.5 MG tablet   simvastatin (ZOCOR) 40 MG tablet       Keep up a regular exercise program and make sure you are eating a healthy diet Try to eat 4 servings of dairy a day, or if you are lactose intolerant take a calcium with vitamin D daily.  Your vaccines are up to date.     Return in about 1 year (around 01/21/2023) for Wellness Exam.     Beatrice Lecher, MD

## 2022-01-20 NOTE — Assessment & Plan Note (Signed)
Repeat  blood pressure was elevated today so encouraged him to come back in 2 weeks to recheck if it is still elevated we may need to make an adjustment on his blood pressure regimen.

## 2022-02-03 ENCOUNTER — Ambulatory Visit (INDEPENDENT_AMBULATORY_CARE_PROVIDER_SITE_OTHER): Payer: BC Managed Care – PPO | Admitting: Family Medicine

## 2022-02-03 VITALS — BP 155/77 | HR 66

## 2022-02-03 DIAGNOSIS — E785 Hyperlipidemia, unspecified: Secondary | ICD-10-CM | POA: Diagnosis not present

## 2022-02-03 DIAGNOSIS — I10 Essential (primary) hypertension: Secondary | ICD-10-CM | POA: Diagnosis not present

## 2022-02-03 DIAGNOSIS — Z0000 Encounter for general adult medical examination without abnormal findings: Secondary | ICD-10-CM | POA: Diagnosis not present

## 2022-02-03 MED ORDER — LISINOPRIL-HYDROCHLOROTHIAZIDE 20-12.5 MG PO TABS: 1.0000 | ORAL_TABLET | Freq: Every day | ORAL | 0 refills | Status: DC

## 2022-02-03 NOTE — Progress Notes (Signed)
BP uncontrolled.  Agree with documentation as above.   Beatrice Lecher, MD

## 2022-02-03 NOTE — Progress Notes (Signed)
Patient comes in today for blood pressure check.   Brett Donaldson is taking Lisinopril- HCTZ 10-12.5 mg for blood pressure control. Wray denies any missed doses, side effects, headaches, chest pain, palpitations, dizziness, or shortness of breath.   [place] [facility] hasn't been checking blood pressure readings at home.  His first blood pressure reading today is: 145/74. After sitting, his second reading is: 155/77.   I spoke with Dr. Madilyn Fireman who advised to increase Lisinopril-HCTZ to 20mg /12.5 mg daily. New prescription sent. Patient made aware. He is getting labs today and will return for a nurse visit in 3-4 weeks for a recheck of his blood pressure.

## 2022-02-04 LAB — COMPLETE METABOLIC PANEL WITH GFR
AG Ratio: 1.7 (calc) (ref 1.0–2.5)
ALT: 30 U/L (ref 9–46)
AST: 27 U/L (ref 10–35)
Albumin: 4.7 g/dL (ref 3.6–5.1)
Alkaline phosphatase (APISO): 85 U/L (ref 35–144)
BUN: 12 mg/dL (ref 7–25)
CO2: 27 mmol/L (ref 20–32)
Calcium: 10 mg/dL (ref 8.6–10.3)
Chloride: 102 mmol/L (ref 98–110)
Creat: 1.07 mg/dL (ref 0.70–1.30)
Globulin: 2.8 g/dL (calc) (ref 1.9–3.7)
Glucose, Bld: 95 mg/dL (ref 65–139)
Potassium: 4.5 mmol/L (ref 3.5–5.3)
Sodium: 139 mmol/L (ref 135–146)
Total Bilirubin: 0.5 mg/dL (ref 0.2–1.2)
Total Protein: 7.5 g/dL (ref 6.1–8.1)
eGFR: 83 mL/min/{1.73_m2} (ref >=60)

## 2022-02-04 LAB — LIPID PANEL
Cholesterol: 250 mg/dL — ABNORMAL HIGH (ref <200)
HDL: 43 mg/dL (ref >=40)
LDL Cholesterol (Calc): 155 mg/dL (calc) — ABNORMAL HIGH
Non-HDL Cholesterol (Calc): 207 mg/dL (calc) — ABNORMAL HIGH (ref <130)
Total CHOL/HDL Ratio: 5.8 (calc) — ABNORMAL HIGH (ref <5.0)
Triglycerides: 339 mg/dL — ABNORMAL HIGH (ref <150)

## 2022-02-04 LAB — CBC
HCT: 46.2 % (ref 38.5–50.0)
Hemoglobin: 15.2 g/dL (ref 13.2–17.1)
MCH: 29.9 pg (ref 27.0–33.0)
MCHC: 32.9 g/dL (ref 32.0–36.0)
MCV: 90.9 fL (ref 80.0–100.0)
MPV: 11.4 fL (ref 7.5–12.5)
Platelets: 255 10*3/uL (ref 140–400)
RBC: 5.08 10*6/uL (ref 4.20–5.80)
RDW: 13 % (ref 11.0–15.0)
WBC: 5.5 10*3/uL (ref 3.8–10.8)

## 2022-02-04 LAB — PSA: PSA: 0.73 ng/mL (ref <=4.00)

## 2022-02-04 MED ORDER — ROSUVASTATIN CALCIUM 40 MG PO TABS: 40.0000 mg | ORAL_TABLET | Freq: Every day | ORAL | 3 refills | Status: DC

## 2022-02-04 NOTE — Progress Notes (Signed)
Orders Placed This Encounter     DISCONTD: lisinopril-hydrochlorothiazide (ZESTORETIC) 10-12.5 MG tablet         Sig: Take 1 tablet by mouth daily.         Dispense:  90 tablet         Refill:  3     DISCONTD: simvastatin (ZOCOR) 40 MG tablet         Sig: TAKE 1 TABLET BY MOUTH EVERYDAY AT BEDTIME         Dispense:  90 tablet         Refill:  3     rosuvastatin (CRESTOR) 40 MG tablet         Sig: Take 1 tablet (40 mg total) by mouth at bedtime.         Dispense:  90 tablet         Refill:  3  Plan to recheck cholesterol and liver enzymes in 2 to 3 months.

## 2022-02-04 NOTE — Progress Notes (Signed)
Call patient: Triglycerides jumped up significantly to 339 it looks much better last year.  DL cholesterol is also up slightly.  I think we need to switch you to a more powerful statin.  It is just not quite getting you where your cholesterol needs to be.  If you are okay with the switch please let me know and I will send over a new prescription.  All other labs are normal.  The 10-year ASCVD risk score (Arnett DK, et al., 2019) is: 10.6%   Values used to calculate the score:     Age: 51 years     Sex: Male     Is Non-Hispanic African American: No     Diabetic: No     Tobacco smoker: No     Systolic Blood Pressure: 761 mmHg     Is BP treated: Yes     HDL Cholesterol: 43 mg/dL     Total Cholesterol: 250 mg/dL

## 2022-02-04 NOTE — Addendum Note (Signed)
Addended by: Beatrice Lecher D on: 02/04/2022 09:49 AM   Modules accepted: Orders

## 2022-02-08 ENCOUNTER — Other Ambulatory Visit: Payer: Self-pay

## 2022-02-08 DIAGNOSIS — I10 Essential (primary) hypertension: Secondary | ICD-10-CM

## 2022-02-08 DIAGNOSIS — E785 Hyperlipidemia, unspecified: Secondary | ICD-10-CM

## 2022-05-04 ENCOUNTER — Other Ambulatory Visit: Payer: Self-pay | Admitting: Family Medicine

## 2023-01-24 ENCOUNTER — Encounter: Payer: Self-pay | Admitting: Family Medicine

## 2023-01-24 ENCOUNTER — Ambulatory Visit (INDEPENDENT_AMBULATORY_CARE_PROVIDER_SITE_OTHER): Payer: BC Managed Care – PPO | Admitting: Family Medicine

## 2023-01-24 VITALS — BP 165/85 | HR 64 | Ht 71.0 in | Wt 195.0 lb

## 2023-01-24 DIAGNOSIS — E785 Hyperlipidemia, unspecified: Secondary | ICD-10-CM

## 2023-01-24 DIAGNOSIS — Z1159 Encounter for screening for other viral diseases: Secondary | ICD-10-CM

## 2023-01-24 DIAGNOSIS — Z1211 Encounter for screening for malignant neoplasm of colon: Secondary | ICD-10-CM

## 2023-01-24 DIAGNOSIS — I10 Essential (primary) hypertension: Secondary | ICD-10-CM

## 2023-01-24 DIAGNOSIS — Z0000 Encounter for general adult medical examination without abnormal findings: Secondary | ICD-10-CM | POA: Diagnosis not present

## 2023-01-24 DIAGNOSIS — Z23 Encounter for immunization: Secondary | ICD-10-CM

## 2023-01-24 MED ORDER — VALSARTAN 160 MG PO TABS: 160.0000 mg | ORAL_TABLET | Freq: Every day | ORAL | 0 refills | Status: DC

## 2023-01-24 MED ORDER — ROSUVASTATIN CALCIUM 40 MG PO TABS: 40.0000 mg | ORAL_TABLET | Freq: Every day | ORAL | 3 refills | Status: DC

## 2023-01-24 NOTE — Patient Instructions (Signed)
In home blood pressure log.  You are also welcome to bring in your home blood pressure cuff if you would like to verify it against our machine.

## 2023-01-24 NOTE — Progress Notes (Signed)
Complete physical exam  Patient: Brett Donaldson   DOB: 1970-08-17   52 y.o. Male  MRN: [id number]  Subjective:    Chief Complaint  Patient presents with   Annual Exam    Brett Donaldson is a 52 y.o. male who presents today for a complete physical exam. He reports consuming a general diet.  Walking and runs 6 days per week  He generally feels well. He reports sleeping well. He does not have additional problems to discuss today.   He also noted that when he bumped up his lisinopril HCTZ dose that it caused significant insomnia he was only getting 2 to 3 hours at night.  He says he recalls that when he first started it years ago he was getting 1 to 2 hours less per night but thought maybe it was just he was getting older.  He stopped the medication temporarily and was easily getting 8 hours of sleep per night he restarted it again and the same thing happened he only got 2 to 3 hours and so he stopped it completely.  He did restart the Crestor which she had temporarily stopped as well, and that has not impacted his sleep quality.  Most recent fall risk assessment:    01/24/2023    8:53 AM  Fall Risk   Falls in the past year? 0  Number falls in past yr: 0  Injury with Fall? 0  Risk for fall due to : No Fall Risks  Follow up Falls evaluation completed     Most recent depression screenings:    01/24/2023    8:53 AM 01/20/2022   11:21 AM  PHQ 2/9 Scores  PHQ - 2 Score 0 0         Patient Care Team: Agapito Games, MD as PCP - General   Outpatient Medications Prior to Visit  Medication Sig Note   [DISCONTINUED] lisinopril-hydrochlorothiazide (ZESTORETIC) 20-12.5 MG tablet Take 1 tablet by mouth daily. NO REFILLS. NEEDS AN APPT FOR BP CHECK 01/24/2023: insomnia   [DISCONTINUED] rosuvastatin (CRESTOR) 40 MG tablet Take 1 tablet (40 mg total) by mouth at bedtime.    No facility-administered medications prior to visit.    ROS        Objective:     BP (!)  163/68   Pulse 62   Ht 5\' 11"  (1.803 m)   Wt 195 lb (88.5 kg)   SpO2 100%   BMI 27.20 kg/m     Physical Exam Constitutional:      Appearance: Normal appearance.  HENT:     Head: Normocephalic and atraumatic.     Right Ear: Tympanic membrane, ear canal and external ear normal.     Left Ear: Tympanic membrane, ear canal and external ear normal.     Nose: Nose normal.     Mouth/Throat:     Pharynx: Oropharynx is clear.  Eyes:     Extraocular Movements: Extraocular movements intact.     Conjunctiva/sclera: Conjunctivae normal.     Pupils: Pupils are equal, round, and reactive to light.  Neck:     Thyroid: No thyromegaly.  Cardiovascular:     Rate and Rhythm: Normal rate and regular rhythm.  Pulmonary:     Effort: Pulmonary effort is normal.     Breath sounds: Normal breath sounds.  Abdominal:     General: Bowel sounds are normal.     Palpations: Abdomen is soft.     Tenderness: There is no abdominal tenderness.  Musculoskeletal:  General: No swelling.     Cervical back: Neck supple.  Skin:    General: Skin is warm and dry.  Neurological:     Mental Status: He is oriented to person, place, and time.  Psychiatric:        Mood and Affect: Mood normal.        Behavior: Behavior normal.      No results found for any visits on 01/24/23.      Assessment & Plan:    Routine Health Maintenance and Physical Exam  Immunization History  Administered Date(s) Administered   Influenza, Seasonal, Injecte, Preservative Fre 01/17/2013, 01/24/2023   Influenza,inj,Quad PF,6+ Mos 12/29/2014, 03/21/2016, 03/01/2017, 12/06/2018, 12/14/2020   Influenza-Unspecified 01/26/2018, 01/16/2020, 01/17/2022   Td 10/24/2006   Tdap 03/01/2017   Zoster Recombinant(Shingrix) 04/01/2020, 12/14/2020    Health Maintenance  Topic Date Due   HIV Screening  Never done   Hepatitis C Screening  Never done   COVID-19 Vaccine (1 - 2023-24 season) Never done   Colonoscopy  12/16/2022    DTaP/Tdap/Td (3 - Td or Tdap) 03/02/2027   INFLUENZA VACCINE  Completed   Zoster Vaccines- Shingrix  Completed   Pneumococcal Vaccine 32-72 Years old  Aged Out   HPV VACCINES  Aged Out    Discussed health benefits of physical activity, and encouraged him to engage in regular exercise appropriate for his age and condition.  Problem List Items Addressed This Visit       Cardiovascular and Mediastinum   HYPERTENSION, BENIGN    Discontinue lisinopril HCTZ that was causing significant insomnia.  Will try valsartan by itself for 30 days.  Check blood pressures at home and follow-up in 2 weeks for nurse visit to recheck blood pressure here.  If it causes insomnia again then we can switch to plain HCTZ      Relevant Medications   rosuvastatin (CRESTOR) 40 MG tablet   valsartan (DIOVAN) 160 MG tablet     Other   Hyperlipidemia   Relevant Medications   rosuvastatin (CRESTOR) 40 MG tablet   valsartan (DIOVAN) 160 MG tablet   Other Visit Diagnoses     Wellness examination    -  Primary   Relevant Orders   CMP14+EGFR   Lipid panel   CBC   Hepatitis C Antibody   Encounter for hepatitis C screening test for low risk patient       Relevant Orders   Hepatitis C Antibody   Screening for colon cancer       Relevant Orders   Ambulatory referral to Gastroenterology   Encounter for immunization       Relevant Orders   Flu vaccine trivalent PF, 6mos and older(Flulaval,Afluria,Fluarix,Fluzone) (Completed)       Keep up a regular exercise program and make sure you are eating a healthy diet Try to eat 4 servings of dairy a day, or if you are lactose intolerant take a calcium with vitamin D daily.  Your vaccines are up to date.   Return in about 2 weeks (around 02/07/2023) for Nurse BP Check.     [name], MD

## 2023-01-24 NOTE — Assessment & Plan Note (Signed)
Discontinue lisinopril HCTZ that was causing significant insomnia.  Will try valsartan by itself for 30 days.  Check blood pressures at home and follow-up in 2 weeks for nurse visit to recheck blood pressure here.  If it causes insomnia again then we can switch to plain HCTZ

## 2023-01-25 LAB — LIPID PANEL
Chol/HDL Ratio: 7.3 ratio — ABNORMAL HIGH (ref 0.0–5.0)
Cholesterol, Total: 248 mg/dL — ABNORMAL HIGH (ref 100–199)
HDL: 34 mg/dL — ABNORMAL LOW (ref >39)
LDL Chol Calc (NIH): 139 mg/dL — ABNORMAL HIGH (ref 0–99)
Triglycerides: 406 mg/dL — ABNORMAL HIGH (ref 0–149)
VLDL Cholesterol Cal: 75 mg/dL — ABNORMAL HIGH (ref 5–40)

## 2023-01-25 LAB — CMP14+EGFR
ALT: 32 [IU]/L (ref 0–44)
AST: 37 [IU]/L (ref 0–40)
Albumin: 4.6 g/dL (ref 3.8–4.9)
Alkaline Phosphatase: 78 [IU]/L (ref 44–121)
BUN/Creatinine Ratio: 11 (ref 9–20)
BUN: 11 mg/dL (ref 6–24)
Bilirubin Total: 0.4 mg/dL (ref 0.0–1.2)
CO2: 23 mmol/L (ref 20–29)
Calcium: 9.2 mg/dL (ref 8.7–10.2)
Chloride: 102 mmol/L (ref 96–106)
Creatinine, Ser: 0.98 mg/dL (ref 0.76–1.27)
Globulin, Total: 2.4 g/dL (ref 1.5–4.5)
Glucose: 91 mg/dL (ref 70–99)
Potassium: 4.3 mmol/L (ref 3.5–5.2)
Sodium: 141 mmol/L (ref 134–144)
Total Protein: 7 g/dL (ref 6.0–8.5)
eGFR: 92 mL/min/{1.73_m2} (ref >59)

## 2023-01-25 LAB — CBC
Hematocrit: 46.1 % (ref 37.5–51.0)
Hemoglobin: 14.7 g/dL (ref 13.0–17.7)
MCH: 29.6 pg (ref 26.6–33.0)
MCHC: 31.9 g/dL (ref 31.5–35.7)
MCV: 93 fL (ref 79–97)
Platelets: 235 10*3/uL (ref 150–450)
RBC: 4.96 x10E6/uL (ref 4.14–5.80)
RDW: 13.5 % (ref 11.6–15.4)
WBC: 4.9 10*3/uL (ref 3.4–10.8)

## 2023-01-25 LAB — HEPATITIS C ANTIBODY: Hep C Virus Ab: NONREACTIVE

## 2023-01-25 NOTE — Progress Notes (Signed)
Call patient: Total cholesterol and LDL are elevated as well as triglycerides.  Aced on your 10-year cardiovascular risk score you have a 15% chance of having a heart attack or stroke over the next 10 years.  I would highly recommend that we start a cholesterol lowering medication such as a statin at bedtime to significantly reduce your risk.  If you are okay with this please let me know and I will send in the medication and we can recheck your labs in 3 months.  Count and metabolic panel look great.  We do screen once in your lifetime for hepatitis C and you are negative.  So encouraged her to try to get back in when you can so that we can recheck your blood pressure and make any adjustments needed.  The 10-year ASCVD risk score (Arnett DK, et al., 2019) is: 15.5%   Values used to calculate the score:     Age: 52 years     Sex: Male     Is Non-Hispanic African American: No     Diabetic: No     Tobacco smoker: No     Systolic Blood Pressure: 165 mmHg     Is BP treated: Yes     HDL Cholesterol: 34 mg/dL     Total Cholesterol: 248 mg/dL

## 2023-01-26 NOTE — Progress Notes (Signed)
Please discontinue the simvastatin and working to switch to [employer] which is called rosuvastatin.  It is a little bit more powerful and so compared head-to-head it should do a better job in controlling his cholesterol levels.

## 2023-02-14 ENCOUNTER — Ambulatory Visit: Payer: BC Managed Care – PPO

## 2023-02-19 ENCOUNTER — Other Ambulatory Visit: Payer: Self-pay | Admitting: Family Medicine

## 2023-02-19 DIAGNOSIS — I10 Essential (primary) hypertension: Secondary | ICD-10-CM

## 2023-03-16 ENCOUNTER — Encounter: Payer: Self-pay | Admitting: Family Medicine

## 2023-03-16 ENCOUNTER — Ambulatory Visit (INDEPENDENT_AMBULATORY_CARE_PROVIDER_SITE_OTHER): Payer: BC Managed Care – PPO | Admitting: Family Medicine

## 2023-03-16 VITALS — BP 143/63 | HR 67 | Ht 71.0 in | Wt 199.0 lb

## 2023-03-16 DIAGNOSIS — I10 Essential (primary) hypertension: Secondary | ICD-10-CM | POA: Diagnosis not present

## 2023-03-16 DIAGNOSIS — B070 Plantar wart: Secondary | ICD-10-CM

## 2023-03-16 MED ORDER — VALSARTAN 320 MG PO TABS: 320.0000 mg | ORAL_TABLET | Freq: Every day | ORAL | 1 refills | Status: DC

## 2023-03-16 NOTE — Progress Notes (Signed)
Established Patient Office Visit  Subjective  Patient ID: Brett Donaldson, male    DOB: 1971/03/11  Age: 52 y.o. MRN: [id number]  Chief Complaint  Patient presents with   Hypertension    HPI  Hypertension- Pt denies chest pain, SOB, dizziness, or heart palpitations.  Taking meds as directed w/o problems.  Denies medication side effects.    After last office visit blood pressures were running in the 140s to 160s at home and by mid November he was starting to see some pressures in the mid 130s.  Plantar warts on both feet.  He said that we froze them several years ago and then they did get better and continue to use a little bit of topical.  Never completely went away and has gradually started to come back.  For the last 12 weeks has been using topical acid again but just has not been effective so he would like to have cryotherapy done again.     ROS    Objective:     BP (!) 143/63   Pulse 67   Ht 5\' 11"  (1.803 m)   Wt 199 lb (90.3 kg)   SpO2 100%   BMI 27.75 kg/m     Physical Exam Vitals and nursing note reviewed.  Constitutional:      Appearance: Normal appearance.  HENT:     Head: Normocephalic and atraumatic.  Eyes:     Conjunctiva/sclera: Conjunctivae normal.  Cardiovascular:     Rate and Rhythm: Normal rate and regular rhythm.  Pulmonary:     Effort: Pulmonary effort is normal.     Breath sounds: Normal breath sounds.  Skin:    General: Skin is warm and dry.  Neurological:     Mental Status: He is alert.  Psychiatric:        Mood and Affect: Mood normal.      No results found for any visits on 03/16/23.     The 10-year ASCVD risk score (Arnett DK, et al., 2019) is: 12.2%    Assessment & Plan:   Problem List Items Addressed This Visit       Cardiovascular and Mediastinum   HYPERTENSION, BENIGN - Primary   Relevant Medications   valsartan (DIOVAN) 320 MG tablet   Other Relevant Orders   Basic Metabolic Panel (BMET)   Other Visit  Diagnoses       Plantar wart           Cryotherapy Procedure Note  Pre-operative Diagnosis: Plantar wart on fifth digit on the right and left foot.  Post-operative Diagnosis: same  Locations: 5th digit on both feet   Indications: irriation   Anesthesia: none  Procedure Details  Patient informed of risks (permanent scarring, infection, light or dark discoloration, bleeding, infection, weakness, numbness and recurrence of the lesion) and benefits of the procedure and verbal informed consent obtained.  The areas are treated with liquid nitrogen therapy, frozen until ice ball extended 1-2 mm beyond lesion, allowed to thaw, and treated again. The patient tolerated procedure well.  The patient was instructed on post-op care, warned that there may be blister formation, redness and pain. Recommend OTC analgesia as needed for pain.  Condition: Stable  Complications: none.  Plan: 1. Instructed to keep the area dry and covered for 24-48h and clean thereafter. 2. Warning signs of infection were reviewed.   3. Recommended that the patient use OTC acetaminophen as needed for pain.  4. Return PRN.    Return in about 4 months (  around 07/15/2023) for Hypertension.    [name], MD

## 2023-05-10 ENCOUNTER — Telehealth: Payer: Self-pay

## 2023-05-10 DIAGNOSIS — I10 Essential (primary) hypertension: Secondary | ICD-10-CM

## 2023-05-10 MED ORDER — VALSARTAN 320 MG PO TABS: 320.0000 mg | ORAL_TABLET | Freq: Every day | ORAL | 1 refills | Status: DC

## 2023-05-10 NOTE — Telephone Encounter (Signed)
 Requesting rx refill of valsartan 

## 2023-05-10 NOTE — Telephone Encounter (Signed)
 Spoke with patient. States [facility]  [facility] and he states he has not used them in years  He has been doubling his valsartan  160mg   but is almost out of medication  [facility].  Patient has upcoming appt schld for July 17, 2023.

## 2023-05-10 NOTE — Telephone Encounter (Signed)
 Copied from CRM [id number]. Topic: Clinical - Prescription Issue >> May 09, 2023  3:28 PM Hilton Lucky wrote: Reason for CRM: Patient wanting Valsartan  prescription sent [facility].

## 2023-07-17 ENCOUNTER — Encounter: Payer: Self-pay | Admitting: Family Medicine

## 2023-07-17 ENCOUNTER — Ambulatory Visit (INDEPENDENT_AMBULATORY_CARE_PROVIDER_SITE_OTHER): Payer: BC Managed Care – PPO | Admitting: Family Medicine

## 2023-07-17 VITALS — BP 126/73 | HR 58 | Ht 71.0 in | Wt 194.0 lb

## 2023-07-17 DIAGNOSIS — I10 Essential (primary) hypertension: Secondary | ICD-10-CM | POA: Diagnosis not present

## 2023-07-17 NOTE — Assessment & Plan Note (Signed)
 Well contoolled. F/U in 6months.

## 2023-07-17 NOTE — Progress Notes (Signed)
   Established Patient Office Visit  Subjective  Patient ID: Brett Donaldson, male    DOB: [birth year]  Age: 53 y.o. MRN: [id number]  Chief Complaint  Patient presents with   Hypertension    HPI  F/U HTN -we adjusted the valsartan to 320 mg at last visit.  For here for recheck.  Says has been tolerating medication well without any problems or side effects    ROS    Objective:     BP 126/73   Pulse (!) 58   Ht 5\' 11"  (1.803 m)   Wt 194 lb (88 kg)   SpO2 100%   BMI 27.06 kg/m    Physical Exam Vitals and nursing note reviewed.  Constitutional:      Appearance: Normal appearance.  HENT:     Head: Normocephalic and atraumatic.  Eyes:     Conjunctiva/sclera: Conjunctivae normal.  Cardiovascular:     Rate and Rhythm: Normal rate and regular rhythm.  Pulmonary:     Effort: Pulmonary effort is normal.     Breath sounds: Normal breath sounds.  Skin:    General: Skin is warm and dry.  Neurological:     Mental Status: He is alert.  Psychiatric:        Mood and Affect: Mood normal.      No results found for any visits on 07/17/23.    The 10-year ASCVD risk score (Arnett DK, et al., 2019) is: 9.9%    Assessment & Plan:   Problem List Items Addressed This Visit       Cardiovascular and Mediastinum   HYPERTENSION, BENIGN - Primary   Well contoolled. F/U in 6months.       Relevant Orders   CMP14+EGFR   Reminded him to schedule his colonoscopy.   Return in about 6 months (around 01/25/2024) for Wellness Exam, Labs .    Duaine German, MD

## 2023-07-18 LAB — CMP14+EGFR
ALT: 39 IU/L (ref 0–44)
AST: 44 IU/L — ABNORMAL HIGH (ref 0–40)
Albumin: 4.5 g/dL (ref 3.8–4.9)
Alkaline Phosphatase: 79 IU/L (ref 44–121)
BUN/Creatinine Ratio: 17 (ref 9–20)
BUN: 18 mg/dL (ref 6–24)
Bilirubin Total: 0.4 mg/dL (ref 0.0–1.2)
CO2: 23 mmol/L (ref 20–29)
Calcium: 9.3 mg/dL (ref 8.7–10.2)
Chloride: 105 mmol/L (ref 96–106)
Creatinine, Ser: 1.08 mg/dL (ref 0.76–1.27)
Globulin, Total: 2 g/dL (ref 1.5–4.5)
Glucose: 91 mg/dL (ref 70–99)
Potassium: 5 mmol/L (ref 3.5–5.2)
Sodium: 139 mmol/L (ref 134–144)
Total Protein: 6.5 g/dL (ref 6.0–8.5)
eGFR: 82 mL/min/{1.73_m2} (ref >59)

## 2023-07-18 NOTE — Progress Notes (Signed)
 Call patient: The AST liver enzymes up just slightly similar to about 5 years ago he just had a transient bump.  But the ALT and alk phosphatase, which are 2 additional liver enzymes, are in the normal range.  I definitely just want to keep an eye on this plan to recheck again in 6 months.  Also just a reminder to schedule his follow-up colonoscopy.

## 2023-07-21 DIAGNOSIS — W57XXXA Bitten or stung by nonvenomous insect and other nonvenomous arthropods, initial encounter: Secondary | ICD-10-CM | POA: Diagnosis not present

## 2023-07-21 DIAGNOSIS — S90464A Insect bite (nonvenomous), right lesser toe(s), initial encounter: Secondary | ICD-10-CM | POA: Diagnosis not present

## 2023-08-02 ENCOUNTER — Other Ambulatory Visit: Payer: Self-pay | Admitting: Family Medicine

## 2023-08-02 DIAGNOSIS — I10 Essential (primary) hypertension: Secondary | ICD-10-CM

## 2023-09-15 DIAGNOSIS — Z860101 Personal history of adenomatous and serrated colon polyps: Secondary | ICD-10-CM | POA: Diagnosis not present

## 2023-09-15 DIAGNOSIS — K635 Polyp of colon: Secondary | ICD-10-CM | POA: Diagnosis not present

## 2023-09-15 DIAGNOSIS — Z09 Encounter for follow-up examination after completed treatment for conditions other than malignant neoplasm: Secondary | ICD-10-CM | POA: Diagnosis not present

## 2023-09-15 DIAGNOSIS — K6389 Other specified diseases of intestine: Secondary | ICD-10-CM | POA: Diagnosis not present

## 2023-09-15 DIAGNOSIS — Z1211 Encounter for screening for malignant neoplasm of colon: Secondary | ICD-10-CM | POA: Diagnosis not present

## 2023-09-15 DIAGNOSIS — K5730 Diverticulosis of large intestine without perforation or abscess without bleeding: Secondary | ICD-10-CM | POA: Diagnosis not present

## 2023-09-15 LAB — HM COLONOSCOPY

## 2023-11-14 ENCOUNTER — Other Ambulatory Visit: Payer: Self-pay | Admitting: Family Medicine

## 2023-11-14 DIAGNOSIS — I10 Essential (primary) hypertension: Secondary | ICD-10-CM

## 2023-12-21 ENCOUNTER — Other Ambulatory Visit: Payer: Self-pay | Admitting: Family Medicine

## 2023-12-21 DIAGNOSIS — I10 Essential (primary) hypertension: Secondary | ICD-10-CM

## 2023-12-22 NOTE — Telephone Encounter (Signed)
 Please call and check with patient.  We have on his list that he is taking valsartan  320 but we just got a refill request for different strength.  What is he actually taking and needs?

## 2023-12-22 NOTE — Telephone Encounter (Signed)
 Left msg for patient to return call to office regarding the need to verify the dose of medication for refill.

## 2023-12-26 NOTE — Telephone Encounter (Signed)
 Spoke with patient. He states he is taking the 320mg   once daily and is requesting rx [facility] [place]

## 2024-01-29 ENCOUNTER — Encounter: Payer: Self-pay | Admitting: Family Medicine

## 2024-01-29 ENCOUNTER — Ambulatory Visit (INDEPENDENT_AMBULATORY_CARE_PROVIDER_SITE_OTHER): Admitting: Family Medicine

## 2024-01-29 VITALS — BP 128/76 | HR 71 | Ht 71.0 in | Wt 198.0 lb

## 2024-01-29 DIAGNOSIS — Z23 Encounter for immunization: Secondary | ICD-10-CM | POA: Diagnosis not present

## 2024-01-29 DIAGNOSIS — Z0000 Encounter for general adult medical examination without abnormal findings: Secondary | ICD-10-CM | POA: Diagnosis not present

## 2024-01-29 NOTE — Progress Notes (Signed)
 Complete physical exam  Patient: Brett Donaldson    DOB: 1970/12/30 53 y.o.   MRN: [id number]  Chief Complaint  Patient presents with   Annual Exam    Subjective:    Brett Donaldson is a 53 y.o. male who presents today for a complete physical exam. He reports consuming a general diet. Walks 3-5 miles for 3-4 days per week.  Strength training 2-3 x per week.  He generally feels well. He does not have additional problems to discuss today.    Most recent fall risk assessment:    01/29/2024    7:20 AM  Fall Risk   Falls in the past year? 0  Number falls in past yr: 0  Injury with Fall? 0  Risk for fall due to : No Fall Risks  Follow up Falls evaluation completed     Most recent depression screenings:    01/29/2024    7:21 AM 01/24/2023    8:53 AM  PHQ 2/9 Scores  PHQ - 2 Score 0 0        Patient Care Team: Alvan Dorothyann BIRCH, MD as PCP - General   ROS    Objective:    BP 128/76   Pulse 71   Ht 5' 11 (1.803 m)   Wt 198 lb (89.8 kg)   SpO2 100%   BMI 27.62 kg/m     Physical Exam Constitutional:      Appearance: Normal appearance.  HENT:     Head: Normocephalic and atraumatic.     Right Ear: Tympanic membrane, ear canal and external ear normal.     Left Ear: Tympanic membrane, ear canal and external ear normal.     Nose: Nose normal.     Mouth/Throat:     Pharynx: Oropharynx is clear.  Eyes:     Extraocular Movements: Extraocular movements intact.     Conjunctiva/sclera: Conjunctivae normal.     Pupils: Pupils are equal, round, and reactive to light.  Neck:     Thyroid: No thyromegaly.  Cardiovascular:     Rate and Rhythm: Normal rate and regular rhythm.  Pulmonary:     Effort: Pulmonary effort is normal.     Breath sounds: Normal breath sounds.  Abdominal:     General: Bowel sounds are normal.     Palpations: Abdomen is soft.     Tenderness: There is no abdominal tenderness.  Musculoskeletal:        General: No swelling.      Cervical back: Neck supple.     Comments: Nodules on 3 DIP joints, affecting both hands.    Skin:    General: Skin is warm and dry.  Neurological:     Mental Status: He is oriented to person, place, and time.  Psychiatric:        Mood and Affect: Mood normal.        Behavior: Behavior normal.       No results found for any visits on 01/29/24.       Assessment & Plan:    Routine Health Maintenance and Physical Exam Immunization History  Administered Date(s) Administered   Influenza, Seasonal, Injecte, Preservative Fre 01/17/2013, 01/24/2023, 01/29/2024   Influenza,inj,Quad PF,6+ Mos 12/29/2014, 03/21/2016, 03/01/2017, 12/06/2018, 12/14/2020   Influenza-Unspecified 01/26/2018, 01/16/2020, 01/17/2022   Td 10/24/2006   Tdap 03/01/2017   Zoster Recombinant(Shingrix) 04/01/2020, 12/14/2020    Health Maintenance  Topic Date Due   HIV Screening  Never done   Hepatitis B Vaccines 19-59 Average Risk (1  of 3 - 19+ 3-dose series) Never done   Pneumococcal Vaccine: 50+ Years (1 of 1 - PCV) Never done   COVID-19 Vaccine (1 - 2025-26 season) Never done   Colonoscopy  09/15/2026   DTaP/Tdap/Td (3 - Td or Tdap) 03/02/2027   Influenza Vaccine  Completed   Hepatitis C Screening  Completed   Zoster Vaccines- Shingrix  Completed   HPV VACCINES  Aged Out   Meningococcal B Vaccine  Aged Out    Discussed health benefits of physical activity, and encouraged him to engage in regular exercise appropriate for his age and condition.  Problem List Items Addressed This Visit   None Visit Diagnoses       Wellness examination    -  Primary   Relevant Orders   Lipid Panel With LDL/HDL Ratio   CMP14+EGFR   CBC     Encounter for immunization       Relevant Orders   Flu vaccine trivalent PF, 6mos and older(Flulaval,Afluria,Fluarix,Fluzone) (Completed)       Assessment and Plan  Keep up a regular exercise program and make sure you are eating a healthy diet Try to eat 4 servings of dairy a  day, or if you are lactose intolerant take a calcium  with vitamin D daily.  Your vaccines are up to date. Encouraged Prevnar 20.     Return in about 6 months (around 07/29/2024) for Hypertension.    Dorothyann Byars, MD [facility] at [facility]

## 2024-01-30 ENCOUNTER — Ambulatory Visit: Payer: Self-pay | Admitting: Family Medicine

## 2024-01-30 DIAGNOSIS — R799 Abnormal finding of blood chemistry, unspecified: Secondary | ICD-10-CM

## 2024-01-30 LAB — CMP14+EGFR
ALT: 36 IU/L (ref 0–44)
AST: 45 IU/L — ABNORMAL HIGH (ref 0–40)
Albumin: 4.6 g/dL (ref 3.8–4.9)
Alkaline Phosphatase: 83 IU/L (ref 47–123)
BUN/Creatinine Ratio: 15 (ref 9–20)
BUN: 19 mg/dL (ref 6–24)
Bilirubin Total: 0.5 mg/dL (ref 0.0–1.2)
CO2: 24 mmol/L (ref 20–29)
Calcium: 9.7 mg/dL (ref 8.7–10.2)
Chloride: 102 mmol/L (ref 96–106)
Creatinine, Ser: 1.3 mg/dL — ABNORMAL HIGH (ref 0.76–1.27)
Globulin, Total: 2.4 g/dL (ref 1.5–4.5)
Glucose: 95 mg/dL (ref 70–99)
Potassium: 4.8 mmol/L (ref 3.5–5.2)
Sodium: 139 mmol/L (ref 134–144)
Total Protein: 7 g/dL (ref 6.0–8.5)
eGFR: 65 mL/min/1.73 (ref >59)

## 2024-01-30 LAB — CBC

## 2024-01-30 LAB — LIPID PANEL WITH LDL/HDL RATIO
Cholesterol, Total: 171 mg/dL (ref 100–199)
HDL: 44 mg/dL (ref >39)
LDL Chol Calc (NIH): 102 mg/dL — ABNORMAL HIGH (ref 0–99)
LDL/HDL Ratio: 2.3 ratio (ref 0.0–3.6)
Triglycerides: 138 mg/dL (ref 0–149)
VLDL Cholesterol Cal: 25 mg/dL (ref 5–40)

## 2024-01-30 LAB — SPECIMEN STATUS REPORT

## 2024-01-30 NOTE — Progress Notes (Signed)
 Hi Brett Donaldson, cholesterol looks much better this time absolutely fantastic.  Kidney function went up slightly normally you are around 1.0 this time it was 1.3 which is a little unusual so I do want to recheck that in a couple of weeks.  Also your AST liver enzyme was slightly elevated but similar to in the past when it occasionally will bump up.  Make sure you are avoiding any frequent alcohol use which can contribute to this.

## 2024-02-19 ENCOUNTER — Other Ambulatory Visit: Payer: Self-pay | Admitting: Family Medicine

## 2024-02-19 DIAGNOSIS — E785 Hyperlipidemia, unspecified: Secondary | ICD-10-CM

## 2024-07-30 ENCOUNTER — Ambulatory Visit: Admitting: Family Medicine
